# Patient Record
Sex: Female | Born: 1985 | Race: Black or African American | Hispanic: No | Marital: Single | Smoking: Never smoker
Health system: Southern US, Community
[De-identification: ages and names within clinical notes are randomized; demographics above are authoritative.]

## PROBLEM LIST (undated history)

## (undated) DIAGNOSIS — J45909 Unspecified asthma, uncomplicated: Secondary | ICD-10-CM

## (undated) DIAGNOSIS — T8859XA Other complications of anesthesia, initial encounter: Secondary | ICD-10-CM

## (undated) DIAGNOSIS — D649 Anemia, unspecified: Secondary | ICD-10-CM

## (undated) DIAGNOSIS — F32A Depression, unspecified: Secondary | ICD-10-CM

## (undated) DIAGNOSIS — T4145XA Adverse effect of unspecified anesthetic, initial encounter: Secondary | ICD-10-CM

## (undated) DIAGNOSIS — F329 Major depressive disorder, single episode, unspecified: Secondary | ICD-10-CM

## (undated) HISTORY — PX: CHOLECYSTECTOMY: SHX55

## (undated) HISTORY — PX: TUBAL LIGATION: SHX77

---

## 2009-12-28 ENCOUNTER — Emergency Department (HOSPITAL_BASED_OUTPATIENT_CLINIC_OR_DEPARTMENT_OTHER): Admission: EM | Admit: 2009-12-28 | Discharge: 2009-12-29 | Payer: Self-pay | Admitting: Emergency Medicine

## 2009-12-28 ENCOUNTER — Ambulatory Visit: Payer: Self-pay | Admitting: Radiology

## 2010-05-04 ENCOUNTER — Emergency Department (HOSPITAL_BASED_OUTPATIENT_CLINIC_OR_DEPARTMENT_OTHER): Admission: EM | Admit: 2010-05-04 | Discharge: 2010-05-04 | Payer: Self-pay | Admitting: Emergency Medicine

## 2010-06-29 ENCOUNTER — Emergency Department (HOSPITAL_BASED_OUTPATIENT_CLINIC_OR_DEPARTMENT_OTHER): Admission: EM | Admit: 2010-06-29 | Discharge: 2010-06-29 | Payer: Self-pay | Admitting: Emergency Medicine

## 2012-02-11 ENCOUNTER — Emergency Department (HOSPITAL_BASED_OUTPATIENT_CLINIC_OR_DEPARTMENT_OTHER)
Admission: EM | Admit: 2012-02-11 | Discharge: 2012-02-11 | Disposition: A | Discharge status: Home / Self Care | Payer: PRIVATE HEALTH INSURANCE | Attending: Emergency Medicine | Admitting: Emergency Medicine

## 2012-02-11 ENCOUNTER — Encounter (HOSPITAL_BASED_OUTPATIENT_CLINIC_OR_DEPARTMENT_OTHER): Payer: Self-pay | Admitting: Emergency Medicine

## 2012-02-11 DIAGNOSIS — Z79899 Other long term (current) drug therapy: Secondary | ICD-10-CM | POA: Insufficient documentation

## 2012-02-11 DIAGNOSIS — J45909 Unspecified asthma, uncomplicated: Secondary | ICD-10-CM | POA: Insufficient documentation

## 2012-02-11 HISTORY — DX: Unspecified asthma, uncomplicated: J45.909

## 2012-02-11 MED ORDER — ALBUTEROL SULFATE (5 MG/ML) 0.5% IN NEBU
5.0000 mg | INHALATION_SOLUTION | Freq: Once | RESPIRATORY_TRACT | Status: AC
  Administered 2012-02-11: 5 mg via RESPIRATORY_TRACT
  Filled 2012-02-11: qty 1

## 2012-02-11 MED ORDER — ALBUTEROL SULFATE HFA 108 (90 BASE) MCG/ACT IN AERS
2.0000 | INHALATION_SPRAY | Freq: Once | RESPIRATORY_TRACT | Status: AC
  Administered 2012-02-11: 2 via RESPIRATORY_TRACT
  Filled 2012-02-11: qty 6.7

## 2012-02-11 NOTE — ED Provider Notes (Signed)
History     CSN: [id number]  Arrival date & time 02/11/12  4098   First MD Initiated Contact with Patient 02/11/12 1853      Chief Complaint  Patient presents with  . Shortness of Breath    (Consider location/radiation/quality/duration/timing/severity/associated sxs/prior treatment) HPI Comments: Pt c/o sob today after running out of her inhaler:pt states that she uses her inhaler 5 to six times a day generally  Patient is a 26 y.o. female presenting with shortness of breath. The history is provided by the patient. No language interpreter was used.  Shortness of Breath  The current episode started today. The problem occurs continuously. The problem has been unchanged. The problem is mild. Nothing aggravates the symptoms. Associated symptoms include shortness of breath.    Past Medical History  Diagnosis Date  . Asthma     Past Surgical History  Procedure Date  . Cesarean section     History reviewed. No pertinent family history.  History  Substance Use Topics  . Smoking status: Not on file  . Smokeless tobacco: Not on file  . Alcohol Use: No    OB History    Grav Para Term Preterm Abortions TAB SAB Ect Mult Living   1               Review of Systems  Constitutional: Negative.   Respiratory: Positive for shortness of breath.   Cardiovascular: Negative.   Neurological: Negative.     Allergies  Review of patient's allergies indicates no known allergies.  Home Medications  No current outpatient prescriptions on file.  BP 140/66  Pulse 97  Temp 97.6 F (36.4 C) (Oral)  Resp 18  Ht 5\' 5"  (1.651 m)  Wt 270 lb (122.471 kg)  BMI 44.93 kg/m2  SpO2 97%  Physical Exam  Nursing note and vitals reviewed. Constitutional: She appears well-developed and well-nourished.  HENT:  Head: Normocephalic and atraumatic.  Right Ear: External ear normal.  Left Ear: External ear normal.  Eyes: Conjunctivae and EOM are normal.  Neck: Neck supple.  Cardiovascular:  Normal rate and regular rhythm.   Pulmonary/Chest: Effort normal. She has wheezes.  Musculoskeletal: Normal range of motion.  Neurological: She is alert.    ED Course  Procedures (including critical care time)  Labs Reviewed - No data to display No results found.   1. Asthma       MDM  Pt no longer wheezing and feeling better after treatment;will send home with inhaler:discussed with pt that she may need to be on a different regimen for asthma as that is to frequent to use he inhaler       Teressa Lower, NP 02/11/12 2029  Teressa Lower, NP 02/11/12 2030

## 2012-02-11 NOTE — Patient Instructions (Signed)
Instructed pt on the proper use of administering albuteral mdi via aerochamber pt tolerated well 

## 2012-02-11 NOTE — ED Notes (Signed)
Sob today.  Hx of asthma. No treatment at home.  No acute resp distress.

## 2012-02-11 NOTE — ED Provider Notes (Signed)
Medical screening examination/treatment/procedure(s) were performed by non-physician practitioner and as supervising physician I was immediately available for consultation/collaboration.  Travonta Gill T [place], MD 02/11/12 2319 

## 2012-08-07 ENCOUNTER — Encounter (HOSPITAL_COMMUNITY): Payer: Self-pay | Admitting: *Deleted

## 2012-08-07 ENCOUNTER — Emergency Department (HOSPITAL_COMMUNITY)
Admission: EM | Admit: 2012-08-07 | Discharge: 2012-08-07 | Disposition: A | Discharge status: Home / Self Care | Payer: PRIVATE HEALTH INSURANCE | Attending: Emergency Medicine | Admitting: Emergency Medicine

## 2012-08-07 DIAGNOSIS — Z79899 Other long term (current) drug therapy: Secondary | ICD-10-CM | POA: Insufficient documentation

## 2012-08-07 DIAGNOSIS — J45901 Unspecified asthma with (acute) exacerbation: Secondary | ICD-10-CM | POA: Insufficient documentation

## 2012-08-07 MED ORDER — ALBUTEROL SULFATE HFA 108 (90 BASE) MCG/ACT IN AERS
2.0000 | INHALATION_SPRAY | RESPIRATORY_TRACT | Status: DC | PRN
  Administered 2012-08-07: 2 via RESPIRATORY_TRACT
  Filled 2012-08-07: qty 6.7

## 2012-08-07 MED ORDER — ALBUTEROL SULFATE (5 MG/ML) 0.5% IN NEBU
5.0000 mg | INHALATION_SOLUTION | Freq: Once | RESPIRATORY_TRACT | Status: AC
  Administered 2012-08-07: 5 mg via RESPIRATORY_TRACT

## 2012-08-07 MED ORDER — ALBUTEROL SULFATE (2.5 MG/3ML) 0.083% IN NEBU: 2.5000 mg | INHALATION_SOLUTION | Freq: Four times a day (QID) | RESPIRATORY_TRACT | Status: DC | PRN

## 2012-08-07 MED ORDER — PREDNISONE 20 MG PO TABS
60.0000 mg | ORAL_TABLET | Freq: Once | ORAL | Status: AC
Start: 2012-08-07 — End: 2012-08-07
  Administered 2012-08-07: 60 mg via ORAL
  Filled 2012-08-07: qty 3

## 2012-08-07 MED ORDER — IPRATROPIUM BROMIDE 0.02 % IN SOLN
0.5000 mg | Freq: Once | RESPIRATORY_TRACT | Status: AC
  Administered 2012-08-07: 0.5 mg via RESPIRATORY_TRACT

## 2012-08-07 MED ORDER — ALBUTEROL SULFATE HFA 108 (90 BASE) MCG/ACT IN AERS: 2.0000 | INHALATION_SPRAY | RESPIRATORY_TRACT | Status: DC | PRN

## 2012-08-07 MED ORDER — AEROCHAMBER PLUS W/MASK MISC
1.0000 | Freq: Once | Status: AC
  Administered 2012-08-07: 1
  Filled 2012-08-07: qty 1

## 2012-08-07 MED ORDER — PREDNISONE 20 MG PO TABS: 60.0000 mg | ORAL_TABLET | Freq: Once | ORAL | Status: DC

## 2012-08-07 MED ORDER — PREDNISONE 20 MG PO TABS: 60.0000 mg | ORAL_TABLET | Freq: Every day | ORAL | Status: DC

## 2012-08-07 NOTE — ED Notes (Signed)
The pt is c/o difficulty breathing ??? Length of time  Audible wheezes.  Hx asthma

## 2012-08-07 NOTE — ED Notes (Signed)
Patient states that she feels a lot better.  When she first got here she was unable to talk.  Sitting up in the bed talking with her friends.  Lungs clear bilaterally
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
## 2012-10-21 ENCOUNTER — Encounter (HOSPITAL_BASED_OUTPATIENT_CLINIC_OR_DEPARTMENT_OTHER): Payer: Self-pay | Admitting: Emergency Medicine

## 2012-10-21 ENCOUNTER — Emergency Department (HOSPITAL_BASED_OUTPATIENT_CLINIC_OR_DEPARTMENT_OTHER)
Admission: EM | Admit: 2012-10-21 | Discharge: 2012-10-21 | Disposition: A | Discharge status: Home / Self Care | Payer: Self-pay | Attending: Emergency Medicine | Admitting: Emergency Medicine

## 2012-10-21 DIAGNOSIS — Z79899 Other long term (current) drug therapy: Secondary | ICD-10-CM | POA: Insufficient documentation

## 2012-10-21 DIAGNOSIS — J45901 Unspecified asthma with (acute) exacerbation: Secondary | ICD-10-CM | POA: Insufficient documentation

## 2012-10-21 MED ORDER — PREDNISONE 20 MG PO TABS: 40.0000 mg | ORAL_TABLET | Freq: Every day | ORAL | Status: DC

## 2012-10-21 MED ORDER — ALBUTEROL SULFATE (5 MG/ML) 0.5% IN NEBU
INHALATION_SOLUTION | RESPIRATORY_TRACT | Status: AC
  Administered 2012-10-21: 2.5 mg
  Filled 2012-10-21: qty 0.5

## 2012-10-21 MED ORDER — ALBUTEROL SULFATE HFA 108 (90 BASE) MCG/ACT IN AERS
INHALATION_SPRAY | RESPIRATORY_TRACT | Status: AC
  Administered 2012-10-21: 15:00:00
  Filled 2012-10-21: qty 6.7

## 2012-10-21 MED ORDER — METHYLPREDNISOLONE SODIUM SUCC 125 MG IJ SOLR
125.0000 mg | Freq: Once | INTRAMUSCULAR | Status: AC
  Administered 2012-10-21: 125 mg via INTRAMUSCULAR
  Filled 2012-10-21: qty 2

## 2012-10-21 NOTE — ED Provider Notes (Signed)
History     CSN: [id number]  Arrival date & time 10/21/12  1424   First MD Initiated Contact with Patient 10/21/12 1428      Chief Complaint  Patient presents with  . Shortness of Breath    (Consider location/radiation/quality/duration/timing/severity/associated sxs/prior treatment) HPI Comments: Pt state that he is out of her inhaler  Patient is a 27 y.o. female presenting with shortness of breath. The history is provided by the patient. No language interpreter was used.  Shortness of Breath Severity:  Moderate Onset quality:  Gradual Timing:  Constant Progression:  Unchanged Chronicity:  Recurrent Relieved by:  Nothing Worsened by:  Nothing tried Ineffective treatments:  None tried Associated symptoms: no chest pain, no cough and no fever   Risk factors: tobacco use     Past Medical History  Diagnosis Date  . Asthma     Past Surgical History  Procedure Laterality Date  . Cesarean section    . Tubal ligation      No family history on file.  History  Substance Use Topics  . Smoking status: Not on file  . Smokeless tobacco: Not on file  . Alcohol Use: No    OB History   Grav Para Term Preterm Abortions TAB SAB Ect Mult Living   1               Review of Systems  Constitutional: Negative for fever.  Respiratory: Positive for shortness of breath. Negative for cough.   Cardiovascular: Negative for chest pain.    Allergies  Review of patient's allergies indicates no known allergies.  Home Medications   Current Outpatient Rx  Name  Route  Sig  Dispense  Refill  . albuterol (PROVENTIL HFA;VENTOLIN HFA) 108 (90 BASE) MCG/ACT inhaler   Inhalation   Inhale 2 puffs into the lungs every 4 (four) hours as needed for wheezing or shortness of breath. Patient used this medication for shortness of breath.   1 Inhaler   0   . albuterol (PROVENTIL HFA;VENTOLIN HFA) 108 (90 BASE) MCG/ACT inhaler   Inhalation   Inhale 2 puffs into the lungs every 6 (six) hours  as needed. For shortness of breath         . albuterol (PROVENTIL) (2.5 MG/3ML) 0.083% nebulizer solution   Nebulization   Take 3 mLs (2.5 mg total) by nebulization every 6 (six) hours as needed for wheezing.   75 mL   12   . ibuprofen (ADVIL,MOTRIN) 400 MG tablet   Oral   Take 400 mg by mouth every 4 (four) hours as needed. For pain         . oxyCODONE-acetaminophen (PERCOCET/ROXICET) 5-325 MG per tablet   Oral   Take 1 tablet by mouth every 4 (four) hours as needed. For pain         . predniSONE (DELTASONE) 20 MG tablet   Oral   Take 3 tablets (60 mg total) by mouth daily.   15 tablet   0     BP 136/76  Pulse 114  Temp(Src) 98.1 F (36.7 C) (Oral)  Resp 36  SpO2 95%  LMP 10/14/2012  Breastfeeding? Unknown  Physical Exam  Nursing note and vitals reviewed. Constitutional: She is oriented to person, place, and time. She appears well-developed and well-nourished.  HENT:  Head: Normocephalic and atraumatic.  Right Ear: External ear normal.  Left Ear: External ear normal.  Eyes: Conjunctivae and EOM are normal.  Neck: Normal range of motion. Neck supple.  Cardiovascular:  Normal rate and regular rhythm.   Pulmonary/Chest: She has wheezes.  Musculoskeletal: Normal range of motion.  Neurological: She is alert and oriented to person, place, and time.  Skin: Skin is warm and dry.  Psychiatric: She has a normal mood and affect.    ED Course  Procedures (including critical care time)  Labs Reviewed - No data to display No results found.   1. Asthma exacerbation       MDM  Pt is doing better at this time:will send home with  steroids and inhaler        Teressa Lower, NP 10/21/12 1524

## 2012-10-21 NOTE — ED Notes (Signed)
SHOB since this am.  Out of inhalers.

## 2012-10-24 NOTE — ED Provider Notes (Signed)
  Medical screening examination/treatment/procedure(s) were performed by non-physician practitioner and as supervising physician I was immediately available for consultation/collaboration.    Delvina Mizzell, MD 10/24/12 2350 

## 2012-12-19 ENCOUNTER — Emergency Department (HOSPITAL_BASED_OUTPATIENT_CLINIC_OR_DEPARTMENT_OTHER)
Admission: EM | Admit: 2012-12-19 | Discharge: 2012-12-19 | Disposition: A | Discharge status: Home / Self Care | Payer: No Typology Code available for payment source | Attending: Emergency Medicine | Admitting: Emergency Medicine

## 2012-12-19 ENCOUNTER — Emergency Department (HOSPITAL_BASED_OUTPATIENT_CLINIC_OR_DEPARTMENT_OTHER): Payer: No Typology Code available for payment source

## 2012-12-19 ENCOUNTER — Encounter (HOSPITAL_BASED_OUTPATIENT_CLINIC_OR_DEPARTMENT_OTHER): Payer: Self-pay | Admitting: *Deleted

## 2012-12-19 DIAGNOSIS — Z79899 Other long term (current) drug therapy: Secondary | ICD-10-CM | POA: Insufficient documentation

## 2012-12-19 DIAGNOSIS — M549 Dorsalgia, unspecified: Secondary | ICD-10-CM

## 2012-12-19 DIAGNOSIS — Y92410 Unspecified street and highway as the place of occurrence of the external cause: Secondary | ICD-10-CM | POA: Insufficient documentation

## 2012-12-19 DIAGNOSIS — IMO0002 Reserved for concepts with insufficient information to code with codable children: Secondary | ICD-10-CM | POA: Insufficient documentation

## 2012-12-19 DIAGNOSIS — Y9389 Activity, other specified: Secondary | ICD-10-CM | POA: Insufficient documentation

## 2012-12-19 DIAGNOSIS — J45909 Unspecified asthma, uncomplicated: Secondary | ICD-10-CM | POA: Insufficient documentation

## 2012-12-19 MED ORDER — HYDROCODONE-ACETAMINOPHEN 5-325 MG PO TABS: 1.0000 | ORAL_TABLET | Freq: Four times a day (QID) | ORAL | Status: DC | PRN

## 2012-12-19 MED ORDER — NAPROXEN 500 MG PO TABS: 500.0000 mg | ORAL_TABLET | Freq: Two times a day (BID) | ORAL | Status: DC
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
## 2014-02-03 ENCOUNTER — Emergency Department (HOSPITAL_BASED_OUTPATIENT_CLINIC_OR_DEPARTMENT_OTHER)
Admission: EM | Admit: 2014-02-03 | Discharge: 2014-02-03 | Disposition: A | Discharge status: Home / Self Care | Payer: BC Managed Care – PPO | Attending: Emergency Medicine | Admitting: Emergency Medicine

## 2014-02-03 ENCOUNTER — Encounter (HOSPITAL_BASED_OUTPATIENT_CLINIC_OR_DEPARTMENT_OTHER): Payer: Self-pay | Admitting: Emergency Medicine

## 2014-02-03 DIAGNOSIS — Z791 Long term (current) use of non-steroidal anti-inflammatories (NSAID): Secondary | ICD-10-CM | POA: Insufficient documentation

## 2014-02-03 DIAGNOSIS — J45901 Unspecified asthma with (acute) exacerbation: Secondary | ICD-10-CM | POA: Insufficient documentation

## 2014-02-03 MED ORDER — PREDNISONE 50 MG PO TABS
60.0000 mg | ORAL_TABLET | Freq: Once | ORAL | Status: AC
  Administered 2014-02-03: 60 mg via ORAL
  Filled 2014-02-03 (×2): qty 1

## 2014-02-03 MED ORDER — PREDNISONE 20 MG PO TABS: ORAL_TABLET | ORAL | Status: DC

## 2014-02-03 MED ORDER — ALBUTEROL SULFATE HFA 108 (90 BASE) MCG/ACT IN AERS
INHALATION_SPRAY | RESPIRATORY_TRACT | Status: AC
  Filled 2014-02-03: qty 6.7

## 2014-02-03 MED ORDER — ALBUTEROL SULFATE HFA 108 (90 BASE) MCG/ACT IN AERS: 2.0000 | INHALATION_SPRAY | RESPIRATORY_TRACT | Status: DC | PRN

## 2014-02-03 MED ORDER — IPRATROPIUM-ALBUTEROL 0.5-2.5 (3) MG/3ML IN SOLN
RESPIRATORY_TRACT | Status: AC
  Administered 2014-02-03: 3 mL
  Filled 2014-02-03: qty 3

## 2014-02-03 MED ORDER — ALBUTEROL SULFATE HFA 108 (90 BASE) MCG/ACT IN AERS
2.0000 | INHALATION_SPRAY | RESPIRATORY_TRACT | Status: DC | PRN
  Administered 2014-02-03: 2 via RESPIRATORY_TRACT

## 2014-02-03 MED ORDER — ALBUTEROL SULFATE (2.5 MG/3ML) 0.083% IN NEBU
INHALATION_SOLUTION | RESPIRATORY_TRACT | Status: AC
  Administered 2014-02-03: 2.5 mg
  Filled 2014-02-03: qty 3

## 2014-02-03 NOTE — Discharge Instructions (Signed)
Asthma, Acute Bronchospasm °Acute bronchospasm caused by asthma is also referred to as an asthma attack. Bronchospasm means your air passages become narrowed. The narrowing is caused by inflammation and tightening of the muscles in the air tubes (bronchi) in your lungs. This can make it hard to breathe or cause you to wheeze and cough. °CAUSES °Possible triggers are: °· Animal dander from the skin, hair, or feathers of animals. °· Dust mites contained in house dust. °· Cockroaches. °· Pollen from trees or grass. °· Mold. °· Cigarette or tobacco smoke. °· Air pollutants such as dust, household cleaners, hair sprays, aerosol sprays, paint fumes, strong chemicals, or strong odors. °· Cold air or weather changes. Cold air may trigger inflammation. Winds increase molds and pollens in the air. °· Strong emotions such as crying or laughing hard. °· Stress. °· Certain medicines such as aspirin or beta-blockers. °· Sulfites in foods and drinks, such as dried fruits and wine. °· Infections or inflammatory conditions, such as a flu, cold, or inflammation of the nasal membranes (rhinitis). °· Gastroesophageal reflux disease (GERD). GERD is a condition where stomach acid backs up into your esophagus. °· Exercise or strenuous activity. °SIGNS AND SYMPTOMS  °· Wheezing. °· Excessive coughing, particularly at night. °· Chest tightness. °· Shortness of breath. °DIAGNOSIS  °Your health care provider will ask you about your medical history and perform a physical exam. A chest X-ray or blood testing may be performed to look for other causes of your symptoms or other conditions that may have triggered your asthma attack.  °TREATMENT  °Treatment is aimed at reducing inflammation and opening up the airways in your lungs.  Most asthma attacks are treated with inhaled medicines. These include quick relief or rescue medicines (such as bronchodilators) and controller medicines (such as inhaled corticosteroids). These medicines are sometimes  given through an inhaler or a nebulizer. Systemic steroid medicine taken by mouth or given through an IV tube also can be used to reduce the inflammation when an attack is moderate or severe. Antibiotic medicines are only used if a bacterial infection is present.  °HOME CARE INSTRUCTIONS  °· Rest. °· Drink plenty of liquids. This helps the mucus to remain thin and be easily coughed up. Only use caffeine in moderation and do not use alcohol until you have recovered from your illness. °· Do not smoke. Avoid being exposed to secondhand smoke. °· You play a critical role in keeping yourself in good health. Avoid exposure to things that cause you to wheeze or to have breathing problems. °· Keep your medicines up-to-date and available. Carefully follow your health care provider's treatment plan. °· Take your medicine exactly as prescribed. °· When pollen or pollution is bad, keep windows closed and use an air conditioner or go to places with air conditioning. °· Asthma requires careful medical care. See your health care provider for a follow-up as advised. If you are more than [redacted] weeks pregnant and you were prescribed any new medicines, let your obstetrician know about the visit and how you are doing. Follow up with your health care provider as directed. °· After you have recovered from your asthma attack, make an appointment with your outpatient doctor to talk about ways to reduce the likelihood of future attacks. If you do not have a doctor who manages your asthma, make an appointment with a primary care doctor to discuss your asthma. °SEEK IMMEDIATE MEDICAL CARE IF:  °· You are getting worse. °· You have trouble breathing. If severe, call your local   emergency services (911 in the U.S.).  You develop chest pain or discomfort.  You are vomiting.  You are not able to keep fluids down.  You are coughing up yellow, green, brown, or bloody sputum.  You have a fever and your symptoms suddenly get worse.  You have  trouble swallowing. MAKE SURE YOU:   Understand these instructions.  Will watch your condition.  Will get help right away if you are not doing well or get worse. Document Released: 11/18/2006 Document Revised: 08/08/2013 Document Reviewed: 02/08/2013 [facility] Patient Information 2015 [place], [place]. This information is not intended to replace advice given to you by your health care provider. Make sure you discuss any questions you have with your health care provider.  Asthma, Adult Asthma is a condition of the lungs in which the airways tighten and narrow. Asthma can make it hard to breathe. Asthma cannot be cured, but medicine and lifestyle changes can help control it. Asthma may be started (triggered) by:  Animal skin flakes (dander).  Dust.  Cockroaches.  Pollen.  Mold.  Smoke.  Cleaning products.  Hair sprays or aerosol sprays.  Paint fumes or strong smells.  Cold air, weather changes, and winds.  Crying or laughing hard.  Stress.  Certain medicines or drugs.  Foods, such as dried fruit, potato chips, and sparkling grape juice.  Infections or conditions (colds, flu).  Exercise.  Certain medical conditions or diseases.  Exercise or tiring activities. HOME CARE   Take medicine as told by your doctor.  Use a peak flow meter as told by your doctor. A peak flow meter is a tool that measures how well the lungs are working.  Record and keep track of the peak flow meter's readings.  Understand and use the asthma action plan. An asthma action plan is a written plan for taking care of your asthma and treating your attacks.  To help prevent asthma attacks:  Do not smoke. Stay away from secondhand smoke.  Change your heating and air conditioning filter often.  Limit your use of fireplaces and wood stoves.  Get rid of pests (such as roaches and mice) and their droppings.  Throw away plants if you see mold on them.  Clean your floors. Dust regularly. Use  cleaning products that do not smell.  Have someone vacuum when you are not home. Use a vacuum cleaner with a HEPA filter if possible.  Replace carpet with wood, tile, or vinyl flooring. Carpet can trap animal skin flakes and dust.  Use allergy-proof pillows, mattress covers, and box spring covers.  Wash bed sheets and blankets every week in hot water and dry them in a dryer.  Use blankets that are made of polyester or cotton.  Clean bathrooms and kitchens with bleach. If possible, have someone repaint the walls in these rooms with mold-resistant paint. Keep out of the rooms that are being cleaned and painted.  Wash hands often. GET HELP IF:  You have make a whistling sound when breaking (wheeze), have shortness of breath, or have a cough even if taking medicine to prevent attacks.  The colored mucus you cough up (sputum) is thicker than usual.  The colored mucus you cough up changes from clear or white to yellow, green, gray, or bloody.  You have problems from the medicine you are taking such as:  A rash.  Itching.  Swelling.  Trouble breathing.  You need reliever medicines more than 2-3 times a week.  Your peak flow measurement is still at  50-79% of your personal best after following the action plan for 1 hour. GET HELP RIGHT AWAY IF:   You seem to be worse and are not responding to medicine during an asthma attack.  You are short of breath even at rest.  You get short of breath when doing very little activity.  You have trouble eating, drinking, or talking.  You have chest pain.  You have a fast heartbeat.  Your lips or fingernails start to turn blue.  You are lightheaded, dizzy, or faint.  Your peak flow is less than 50% of your personal best.  You have a fever or lasting symptoms for more than 2-3 days.  You have a fever and your symptoms suddenly get worse. MAKE SURE YOU:   Understand these instructions.  Will watch your condition.  Will get help  right away if you are not doing well or get worse. Document Released: 01/20/2008 Document Revised: 05/24/2013 Document Reviewed: 03/02/2013 [facility] Patient Information 2015 [place], [place]. This information is not intended to replace advice given to you by your health care provider. Make sure you discuss any questions you have with your health care provider.   Emergency Department Resource Guide 1) Find a Doctor and Pay Out of Pocket Although you won't have to find out who is covered by your insurance plan, it is a good idea to ask around and get recommendations. You will then need to call the office and see if the doctor you have chosen will accept you as a new patient and what types of options they offer for patients who are self-pay. Some doctors offer discounts or will set up payment plans for their patients who do not have insurance, but you will need to ask so you aren't surprised when you get to your appointment.  2) Contact Your Local Health Department Not all health departments have doctors that can see patients for sick visits, but many do, so it is worth a call to see if yours does. If you don't know where your local health department is, you can check in your phone book. The CDC also has a tool to help you locate your state's health department, and many state websites also have listings of all of their local health departments.  3) Find a Walk-in Clinic If your illness is not likely to be very severe or complicated, you may want to try a walk in clinic. These are popping up all over the country in pharmacies, drugstores, and shopping centers. They're usually staffed by nurse practitioners or physician assistants that have been trained to treat common illnesses and complaints. They're usually fairly quick and inexpensive. However, if you have serious medical issues or chronic medical problems, these are probably not your best option.  No Primary Care Doctor: - Call Health Connect at   [phone] - they can help you locate a primary care doctor that  accepts your insurance, provides certain services, etc. - Physician Referral Service- [phone]  Chronic Pain Problems: Organization         Address  Phone   Notes  [facility]  [phone] Patients need to be referred by their primary care doctor.   Medication Assistance: Organization         Address  Phone   Notes  [facility] Medication [facility] [address] [place], [place] [address] [phone] --Must be a resident of [facility] [place] -- Must have NO insurance coverage whatsoever (no Medicaid/ Medicare, etc.) --  The pt. MUST have a primary care doctor that directs their care regularly and follows them in the community   MedAssist  [phone]) [phone]   [employer]  [phone]) [phone]    Agencies that provide inexpensive medical care: Organization         Address  Phone   Notes  [facility]  [phone]) [phone]   [facility]    [phone]   [facility] [address] [place], [place] [address] [phone]   [facility] [address], [place] [phone]   Planned Parenthood    [phone](336) [phone]   [facility]    [phone]   [facility]  [address], [place] Phone:  [phone]) [phone], Fax:  [phone]) [phone] Hours of Operation:  9 am - 6 pm, M-F.  Also accepts Medicaid/Medicare and self-pay.  [facility]  [address], [place] Phone: [phone], Fax: [phone]. Hours of Operation:  8:30 am - 5:30 pm, M-F.  Also accepts Medicaid and self-pay.  [facility] [place] [address], [place] Phone: [phone]   [facility] [address], [place], [place] [phone], Ext. [phone] Mondays & Thursdays: 7-9 AM.  First 15 patients are seen on a first come, first serve basis.     Medicaid-accepting [facility] [place] Providers:  Organization         Address  Phone   Notes  [facility] [address], [place] [phone]) [phone] Also accepts self-pay patients.  [facility] [address], [place]  [phone]   [facility] [address], [place] [phone]   [facility] [address] [place] [address], [place] [phone]) [phone]   Renaye RakersVeita Bland [address], [place]   [phone] Only accepts [place] Access [place] patients after they have their name applied to their card.   Self-Pay (no insurance) in [facility] [place]:  Organization         Address  Phone   Notes  Sickle Cell Patients, [facility] [address], [place] [phone]   [facility] [address], [place] [phone]) [phone]   [facility] [place]  [address], [place] [phone]) [phone]   [facility]/Dr. Osei-Bonsu  [address] [place] [address], [place] or [phone], [place] [phone] Phone number for both [place] and [place] locations is the same.  [facility] [address], [place] [phone]   [facility] [place] [address] [place] [address], [place] or [address] [phone]   [facility] [address], [place] [phone]) [phone], phone; [phone]) [phone], fax Sees patients 1st and 3rd Saturday of every month.  Must not qualify for public or private insurance (i.e. Medicaid, Medicare, [place] Health Choice, Veterans' Benefits)  Household income should be no more than 200% of the poverty level The clinic cannot treat you if you are pregnant or think you are pregnant  Sexually transmitted diseases are not treated at the clinic.   Dental Care: Organization         Address  Phone  Notes  [facility]  Department of Public Health [facility] [address], [place] [phone] Accepts children up to age 35 who are enrolled in Medicaid or [place] Health Choice; pregnant women with a Medicaid card; and children who have applied for Medicaid or [place] Health Choice, but were declined, whose parents can pay a reduced fee at time of service.  [facility]  [address], [place] [phone] Accepts children up to age 59 who are enrolled in [place] or [place]; pregnant women with a Medicaid card; and children who have applied for Medicaid or [place] Health Choice, but were declined, whose parents can pay a reduced fee at time of service.  [facility]  [place] [phone] Patients are seen by appointment only. Walk-ins are not accepted. In[place] will see patients 66 years of age and older. Monday - Tuesday (8am-5pm) Most Wednesdays (8:30-5pm) $30 per visit, cash only  [facility] Access [facility]  [address], [facility] [phone] Patients are seen by appointment only. Walk-ins are not accepted. [place] will see patients 60 years of age and older. One Wednesday Evening (Monthly: Volunteer Based).  $30 per visit, cash only  [place]  [phone] for adults; Children under age 17, call Graduate Pediatric Dentistry at [phone]. Children aged 8-14, please call [phone] to request a pediatric application.  Dental services are provided in all areas of dental care including fillings, crowns and bridges, complete and partial dentures, implants, gum treatment, root canals, and extractions. Preventive care is also provided. Treatment is provided to both adults and children. Patients are selected via a lottery and there is often a waiting list.   [facility] [address], [place]  [phone]  [email or web address]   [facility] [address] [place], [place] [phone], Ext. [phone] Second and Fourth Thursday of each month, opens at 6:30 AM; Clinic ends at 9 AM.  Patients are seen on a first-come first-served basis, and a limited number are seen during each clinic.   [facility]  [address] Hillard Danker [place], [place] [phone]   Eligibility Requirements You must have lived in [place], [place], or [place] counties for at least the last three months.   You cannot be eligible for state or federal sponsored [employer], including [employer], [place], or [employer].   You generally cannot be eligible for healthcare insurance through your employer.    How to apply: Eligibility screenings are held every Tuesday and Wednesday afternoon from 1:00 pm until 4:00 pm. You do not need an appointment for the interview!  [facility] [address] [place], [place]   [place] Department  [place]  [phone]    Behavioral Health Resources in the Community: Intensive Outpatient Programs Organization         Address  Phone  Notes  Orinda Lena. [address] [place], [place] [phone]   [facility] Outpatient [address] [place], [place]   ADS: [facility] Drug S[facility] [address] [place], [place]   [place] [address]  [place], [place] or [phone]   Substance Abuse Resources Organization         Address  Phone  Notes  Alcohol and Drug  Services  [phone]   [facility]  [phone]   [facility] [place]  [phone]   Floydene Flock  [phone]   Residential & Outpatient Substance Abuse Program  [phone]   Psychological Services Organization          Address  Phone  Notes  [facility]  [phone]   [facility]  [phone]   [facility] [address] [place] [phone] or [phone]    Mobile Crisis Teams Organization         Address  Phone  Notes  [facility], Mobile Crisis Care Unit  [phone]   [facility]  [address] [place], [place] [phone]   Doristine Locks [address] [place] [phone]    Self-Help/Support Groups Organization         Address  Phone             Notes  [facility] [place] - variety of support groups  [phone]- I7437963 Call for more information  Narcotics Anonymous (NA), [facility] [address], [employer] [place]  2 meetings at this location   Statistician         Address  Phone  Notes  [facility] [address],    [place]  [phone]   [facility]  [address] [place] [address]27, [place], [place] [phone]   [facility] [address] [place], [place] [phone] Admissions: 8am-3pm M-F  [facility] [address]    [place], [place] [phone]   [facility] [address] [place], [place], [place] [phone]   [facility] [address]  [place], [place] [phone]   [facility] - Intensive Outpatient [address], [place], [place] [phone]   [facility] ([facility] [address]  [place], [place] [phone] or [phone]   [facility] ([facility]) [address] [place], [place] [phone] Accepts Medicaid  [facility] [place] [address]  [place] [phone] Substance Abuse/Addiction Treatment   [facility] Organization         Address  Phone  Notes  [facility]  [phone]   Angie Fava, PhD [address]vin Kn[address] [place], [place]   [phone] or [phone]   [facility]   [address] [place], [place] [phone]   [facility] [address] [place], [place] [phone] Insurance/Medicaid/sponsorship through [facility] and Families [address]                                    [place], [place] [phone] Therapy/tele-psych/case  [facility] [address], [place] [phone]    Dr. Lolly Mustache  [phone]   [facility] [place]  [employer] [facility] Dept. 1) [address] [place] 2) [address] [place] 3)  [address], [place] [phone]  [phone]   [facility] Child Abuse Hotline [phone] or [phone] (After Hours)

## 2014-02-03 NOTE — ED Provider Notes (Signed)
CSN: [id number]     Arrival date & time 02/03/14  [id number] History   First MD Initiated Contact with Patient 02/03/14 [id number]     Chief Complaint  Patient presents with  . Wheezing     (Consider location/radiation/quality/duration/timing/severity/associated sxs/prior Treatment) HPI 28 year old female with history of asthma started coughing and wheezing just a little bit yesterday but this morning at work was coughing and wheezing and felt short of breath so she came to the ED for evaluation. She is mild to moderate shortness of breath today. She is no fever no confusion no rash no vomiting no chest pain no abdominal pain and no other concerns. There is no treatment prior to arrival. She has had wheezing like this multiple times in the past. She ran out of her inhaler and does not have a primary care Dr. Past Medical History  Diagnosis Date  . Asthma    Past Surgical History  Procedure Laterality Date  . Cesarean section    . Tubal ligation     No family history on file. History  Substance Use Topics  . Smoking status: Never Smoker   . Smokeless tobacco: Not on file  . Alcohol Use: No   OB History   Grav Para Term Preterm Abortions TAB SAB Ect Mult Living   1              Review of Systems  10 Systems reviewed and are negative for acute change except as noted in the HPI.  Allergies  Review of patient's allergies indicates no known allergies.  Home Medications   Prior to Admission medications   Medication Sig Start Date End Date Taking? Authorizing Provider  albuterol (PROVENTIL HFA;VENTOLIN HFA) 108 (90 BASE) MCG/ACT inhaler Inhale 2 puffs into the lungs every 4 (four) hours as needed for wheezing or shortness of breath. Patient used this medication for shortness of breath. 08/07/12   Olivia Mackielga M Otter, MD  albuterol (PROVENTIL HFA;VENTOLIN HFA) 108 (90 BASE) MCG/ACT inhaler Inhale 2 puffs into the lungs every 6 (six) hours as needed. For shortness of breath    Historical Provider, MD   albuterol (PROVENTIL HFA;VENTOLIN HFA) 108 (90 BASE) MCG/ACT inhaler Inhale 2 puffs into the lungs every 2 (two) hours as needed for wheezing or shortness of breath (cough). 02/03/14   Hurman HornJohn M Bednar, MD  albuterol (PROVENTIL) (2.5 MG/3ML) 0.083% nebulizer solution Take 3 mLs (2.5 mg total) by nebulization every 6 (six) hours as needed for wheezing. 08/07/12   Olivia Mackielga M Otter, MD  HYDROcodone-acetaminophen (NORCO/VICODIN) 5-325 MG per tablet Take 1-2 tablets by mouth every 6 (six) hours as needed for pain. 12/19/12   Vanetta MuldersScott Zackowski, MD  ibuprofen (ADVIL,MOTRIN) 400 MG tablet Take 400 mg by mouth every 4 (four) hours as needed. For pain    Historical Provider, MD  naproxen (NAPROSYN) 500 MG tablet Take 1 tablet (500 mg total) by mouth 2 (two) times daily. 12/19/12   Vanetta MuldersScott Zackowski, MD  oxyCODONE-acetaminophen (PERCOCET/ROXICET) 5-325 MG per tablet Take 1 tablet by mouth every 4 (four) hours as needed. For pain    Historical Provider, MD  predniSONE (DELTASONE) 20 MG tablet Take 3 tablets (60 mg total) by mouth daily. 08/07/12   Olivia Mackielga M Otter, MD  predniSONE (DELTASONE) 20 MG tablet Take 2 tablets (40 mg total) by mouth daily. 10/21/12   Teressa LowerVrinda Pickering, NP  predniSONE (DELTASONE) 20 MG tablet 2 tabs po daily x 4 days 02/03/14   Hurman HornJohn M Bednar, MD   BP 126/62  Pulse 92  Temp(Src) 98.1 F (36.7 C) (Oral)  Resp 19  Ht 5\' 5"  (1.651 m)  Wt 240 lb (108.863 kg)  BMI 39.94 kg/m2  SpO2 99% Physical Exam  Nursing note and vitals reviewed. Constitutional:  Awake, alert, nontoxic appearance.  HENT:  Head: Atraumatic.  Eyes: Right eye exhibits no discharge. Left eye exhibits no discharge.  Neck: Neck supple.  Cardiovascular: Normal rate and regular rhythm.   No murmur heard. Pulmonary/Chest: She is in respiratory distress. She has wheezes. She has no rales. She exhibits no tenderness.  Mild to moderate respiratory distress upon arrival with pulse oximetry normal on room air at 95% with patient able to speak  full sentences without retractions or accessory muscle usage however she does have diffuse expiratory wheezes  Abdominal: Soft. Bowel sounds are normal. She exhibits no distension and no mass. There is no tenderness. There is no rebound and no guarding.  Musculoskeletal: She exhibits no edema and no tenderness.  Baseline ROM, no obvious new focal weakness.  Neurological: She is alert.  Mental status and motor strength appears baseline for patient and situation.  Skin: No rash noted.  Psychiatric: She has a normal mood and affect.    ED Course  Procedures (including critical care time) Pt feels improved after observation and/or treatment in ED.Patient informed of clinical course, understand medical decision-making process, and agree with plan. Labs Review Labs Reviewed - No data to display  Imaging Review No results found.   EKG Interpretation None      MDM   Final diagnoses:  Asthma exacerbation    I doubt any other EMC precluding discharge at this time including, but not necessarily limited to the following: status asthmaticus, SBI.    Hurman HornJohn M Bednar, MD 02/05/14 [phone]

## 2014-02-03 NOTE — ED Notes (Signed)
Patient audibly wheezing this morning. She has a hx of asthma and works in the heat. She does not have a regular dr and she does not have asthma medications at home.

## 2014-06-18 ENCOUNTER — Encounter (HOSPITAL_BASED_OUTPATIENT_CLINIC_OR_DEPARTMENT_OTHER): Payer: Self-pay | Admitting: Emergency Medicine

## 2015-05-31 DIAGNOSIS — J4540 Moderate persistent asthma, uncomplicated: Secondary | ICD-10-CM | POA: Insufficient documentation

## 2015-05-31 DIAGNOSIS — J309 Allergic rhinitis, unspecified: Secondary | ICD-10-CM | POA: Insufficient documentation

## 2015-06-03 ENCOUNTER — Ambulatory Visit: Payer: Self-pay | Admitting: Allergy and Immunology

## 2015-09-22 ENCOUNTER — Emergency Department (HOSPITAL_BASED_OUTPATIENT_CLINIC_OR_DEPARTMENT_OTHER)
Admission: EM | Admit: 2015-09-22 | Discharge: 2015-09-23 | Disposition: A | Discharge status: Home / Self Care | Payer: BLUE CROSS/BLUE SHIELD | Attending: Emergency Medicine | Admitting: Emergency Medicine

## 2015-09-22 ENCOUNTER — Encounter (HOSPITAL_BASED_OUTPATIENT_CLINIC_OR_DEPARTMENT_OTHER): Payer: Self-pay | Admitting: *Deleted

## 2015-09-22 DIAGNOSIS — Z79899 Other long term (current) drug therapy: Secondary | ICD-10-CM | POA: Diagnosis not present

## 2015-09-22 DIAGNOSIS — J4520 Mild intermittent asthma, uncomplicated: Secondary | ICD-10-CM

## 2015-09-22 DIAGNOSIS — J4521 Mild intermittent asthma with (acute) exacerbation: Secondary | ICD-10-CM | POA: Diagnosis not present

## 2015-09-22 DIAGNOSIS — Z7951 Long term (current) use of inhaled steroids: Secondary | ICD-10-CM | POA: Insufficient documentation

## 2015-09-22 DIAGNOSIS — Z3202 Encounter for pregnancy test, result negative: Secondary | ICD-10-CM | POA: Insufficient documentation

## 2015-09-22 DIAGNOSIS — R0602 Shortness of breath: Secondary | ICD-10-CM | POA: Diagnosis present

## 2015-09-22 MED ORDER — GUAIFENESIN ER 600 MG PO TB12
1200.0000 mg | ORAL_TABLET | Freq: Two times a day (BID) | ORAL | Status: DC
  Filled 2015-09-22: qty 2

## 2015-09-22 MED ORDER — PREDNISONE 50 MG PO TABS
60.0000 mg | ORAL_TABLET | Freq: Once | ORAL | Status: AC
  Administered 2015-09-23: 60 mg via ORAL
  Filled 2015-09-22: qty 1

## 2015-09-22 MED ORDER — IPRATROPIUM-ALBUTEROL 0.5-2.5 (3) MG/3ML IN SOLN
RESPIRATORY_TRACT | Status: AC
  Filled 2015-09-22: qty 3

## 2015-09-22 MED ORDER — LORATADINE 10 MG PO TABS
10.0000 mg | ORAL_TABLET | Freq: Once | ORAL | Status: AC
  Administered 2015-09-23: 10 mg via ORAL
  Filled 2015-09-22: qty 1

## 2015-09-22 MED ORDER — IPRATROPIUM-ALBUTEROL 0.5-2.5 (3) MG/3ML IN SOLN
3.0000 mL | Freq: Four times a day (QID) | RESPIRATORY_TRACT | Status: DC
  Administered 2015-09-22: 3 mL via RESPIRATORY_TRACT

## 2015-09-22 NOTE — ED Provider Notes (Signed)
CSN: [id number]     Arrival date & time 09/22/15  2348 History  By signing my name below, I, Bethel Born, attest that this documentation has been prepared under the direction and in the presence of Codylee Patil, MD. Electronically Signed: Bethel Born, ED Scribe. 09/22/2015. 11:57 PM   Chief Complaint  Patient presents with  . Shortness of Breath    Patient is a 30 y.o. female presenting with shortness of breath. The history is provided by the patient. No language interpreter was used.  Shortness of Breath Severity:  Moderate Onset quality:  Gradual Duration:  1 day Timing:  Constant Progression:  Unchanged Chronicity:  New Context: smoke exposure   Relieved by:  Nothing Worsened by:  Nothing tried Ineffective treatments:  None tried Associated symptoms: wheezing   Associated symptoms: no chest pain and no fever   Wheezing:    Severity:  Moderate   Onset quality:  Gradual   Timing:  Constant   Progression:  Unchanged   Chronicity:  Recurrent Risk factors: no hx of PE/DVT, no oral contraceptive use, no prolonged immobilization and no tobacco use    Sarah Drake is a 30 y.o. female with history of asthma who presents to the Emergency Department complaining of constant SOB with gradual onset today. Associated symptoms include nasal congestion, chest congestion, and wheezing. Cough drops and Theraflu provided insufficient relief in symptoms at home. She is out of her home inhalers.  Pt denies LE swelling.  She smokes marijuana but denies tobacco. Pt is not on hormonal birth control. She recently drove to [place] but took breaks. LNMP was 08/30/15.  Past Medical History  Diagnosis Date  . Asthma    Past Surgical History  Procedure Laterality Date  . Cesarean section    . Tubal ligation     History reviewed. No pertinent family history. Social History  Substance Use Topics  . Smoking status: Never Smoker   . Smokeless tobacco: None  . Alcohol Use: No   OB History     Gravida Para Term Preterm AB TAB SAB Ectopic Multiple Living   1              Review of Systems  Constitutional: Negative for fever.  HENT: Positive for congestion.   Respiratory: Positive for shortness of breath and wheezing.   Cardiovascular: Negative for chest pain and leg swelling.  All other systems reviewed and are negative.  Allergies  Review of patient's allergies indicates no known allergies.  Home Medications   Prior to Admission medications   Medication Sig Start Date End Date Taking? Authorizing Provider  albuterol (PROVENTIL HFA;VENTOLIN HFA) 108 (90 BASE) MCG/ACT inhaler Inhale 2 puffs into the lungs every 6 (six) hours as needed. For shortness of breath    Historical Provider, MD  beclomethasone (QVAR) 80 MCG/ACT inhaler Inhale 2 puffs into the lungs 2 (two) times daily.    Historical Provider, MD   BP 117/66 mmHg  Pulse 95  Temp(Src) 97.8 F (36.6 C) (Oral)  Resp 18  Ht  (1.651 m)  Wt 205 lb (92.987 kg)  BMI 34.11 kg/m2  SpO2 98%  LMP 08/30/2015 Physical Exam  Constitutional: She is oriented to person, place, and time. She appears well-developed and well-nourished. No distress.  HENT:  Head: Normocephalic.  Mouth/Throat: Oropharynx is clear and moist. No oropharyngeal exudate.  Moist mucous membranes No exudate No swelling of the lips, tongue, or uvula  Eyes: Pupils are equal, round, and reactive to light.  Neck:  Normal range of motion. Neck supple.  Trachea midline No bruit  Cardiovascular: Normal rate and regular rhythm.   Pulmonary/Chest: Effort normal. No stridor. No respiratory distress. She has wheezes. She has no rales.  Abdominal: Soft. She exhibits no mass. There is no tenderness. There is no rebound and no guarding.  Musculoskeletal: Normal range of motion.  Lymphadenopathy:    She has no cervical adenopathy.  Neurological: She is alert and oriented to person, place, and time.  Skin: Skin is warm and dry.  Psychiatric: She has a  normal mood and affect. Her behavior is normal.  Nursing note and vitals reviewed.   ED Course  Procedures (including critical care time) DIAGNOSTIC STUDIES: Oxygen Saturation is 98% on RA,  normal by my interpretation.    COORDINATION OF CARE: 11:56 PM Discussed treatment plan which includes lab work, CXR, a breathing treatment, prednisone, Claritin, and Mucinex with pt at bedside and pt agreed to plan.  Labs Review Labs Reviewed  PREGNANCY, URINE    Imaging Review No results found. I have personally reviewed and evaluated these images and lab results as part of my medical decision-making.   EKG Interpretation None      MDM   Final diagnoses:  None   PERC negative wells 0 highly doubt PE Medications  predniSONE (DELTASONE) tablet 60 mg (60 mg Oral Given 09/23/15 0017)  loratadine (CLARITIN) tablet 10 mg (10 mg Oral Given 09/23/15 0017)  guaiFENesin (ROBITUSSIN) 100 MG/5ML solution 100 mg (100 mg Oral Given 09/23/15 0017)  albuterol (PROVENTIL HFA;VENTOLIN HFA) 108 (90 Base) MCG/ACT inhaler 2 puff (2 puffs Inhalation Given 09/23/15 0137)   Feels better post meds.  RX for albuterol and prednisone given.  Strict return precautions given  I personally performed the services described in this documentation, which was scribed in my presence. The recorded information has been reviewed and is accurate.       Cy Blamer, MD 09/23/15 [phone]

## 2015-09-22 NOTE — ED Notes (Signed)
Pt has asthma.  States SOB x 1 day.  Pt speaking in broken sentences.  BS diminished bilaterally.

## 2015-09-23 ENCOUNTER — Emergency Department (HOSPITAL_BASED_OUTPATIENT_CLINIC_OR_DEPARTMENT_OTHER): Payer: BLUE CROSS/BLUE SHIELD

## 2015-09-23 ENCOUNTER — Encounter (HOSPITAL_BASED_OUTPATIENT_CLINIC_OR_DEPARTMENT_OTHER): Payer: Self-pay | Admitting: Emergency Medicine

## 2015-09-23 LAB — PREGNANCY, URINE: Preg Test, Ur: NEGATIVE

## 2015-09-23 MED ORDER — PREDNISONE 20 MG PO TABS: ORAL_TABLET | ORAL | Status: DC

## 2015-09-23 MED ORDER — ALBUTEROL SULFATE HFA 108 (90 BASE) MCG/ACT IN AERS
2.0000 | INHALATION_SPRAY | Freq: Once | RESPIRATORY_TRACT | Status: AC
  Administered 2015-09-23: 2 via RESPIRATORY_TRACT
  Filled 2015-09-23: qty 6.7

## 2015-09-23 MED ORDER — GUAIFENESIN 100 MG/5ML PO SOLN
5.0000 mL | Freq: Once | ORAL | Status: AC
  Administered 2015-09-23: 100 mg via ORAL
  Filled 2015-09-23: qty 5

## 2015-09-23 MED ORDER — ALBUTEROL SULFATE HFA 108 (90 BASE) MCG/ACT IN AERS: 1.0000 | INHALATION_SPRAY | Freq: Four times a day (QID) | RESPIRATORY_TRACT | Status: DC | PRN

## 2015-09-23 NOTE — Discharge Instructions (Signed)
Asthma, Adult Asthma is a condition of the lungs in which the airways tighten and narrow. Asthma can make it hard to breathe. Asthma cannot be cured, but medicine and lifestyle changes can help control it. Asthma may be started (triggered) by:  Animal skin flakes (dander).  Dust.  Cockroaches.  Pollen.  Mold.  Smoke.  Cleaning products.  Hair sprays or aerosol sprays.  Paint fumes or strong smells.  Cold air, weather changes, and winds.  Crying or laughing hard.  Stress.  Certain medicines or drugs.  Foods, such as dried fruit, potato chips, and sparkling grape juice.  Infections or conditions (colds, flu).  Exercise.  Certain medical conditions or diseases.  Exercise or tiring activities. HOME CARE   Take medicine as told by your doctor.  Use a peak flow meter as told by your doctor. A peak flow meter is a tool that measures how well the lungs are working.  Record and keep track of the peak flow meter's readings.  Understand and use the asthma action plan. An asthma action plan is a written plan for taking care of your asthma and treating your attacks.  To help prevent asthma attacks:  Do not smoke. Stay away from secondhand smoke.  Change your heating and air conditioning filter often.  Limit your use of fireplaces and wood stoves.  Get rid of pests (such as roaches and mice) and their droppings.  Throw away plants if you see mold on them.  Clean your floors. Dust regularly. Use cleaning products that do not smell.  Have someone vacuum when you are not home. Use a vacuum cleaner with a HEPA filter if possible.  Replace carpet with wood, tile, or vinyl flooring. Carpet can trap animal skin flakes and dust.  Use allergy-proof pillows, mattress covers, and box spring covers.  Wash bed sheets and blankets every week in hot water and dry them in a dryer.  Use blankets that are made of polyester or cotton.  Clean bathrooms and kitchens with bleach.  If possible, have someone repaint the walls in these rooms with mold-resistant paint. Keep out of the rooms that are being cleaned and painted.  Wash hands often. GET HELP IF:  You have make a whistling sound when breaking (wheeze), have shortness of breath, or have a cough even if taking medicine to prevent attacks.  The colored mucus you cough up (sputum) is thicker than usual.  The colored mucus you cough up changes from clear or white to yellow, green, gray, or bloody.  You have problems from the medicine you are taking such as:  A rash.  Itching.  Swelling.  Trouble breathing.  You need reliever medicines more than 2-3 times a week.  Your peak flow measurement is still at 50-79% of your personal best after following the action plan for 1 hour.  You have a fever. GET HELP RIGHT AWAY IF:   You seem to be worse and are not responding to medicine during an asthma attack.  You are short of breath even at rest.  You get short of breath when doing very little activity.  You have trouble eating, drinking, or talking.  You have chest pain.  You have a fast heartbeat.  Your lips or fingernails start to turn blue.  You are light-headed, dizzy, or faint.  Your peak flow is less than 50% of your personal best.   This information is not intended to replace advice given to you by your health care provider. Make sure   you discuss any questions you have with your health care provider.   Document Released: 01/20/2008 Document Revised: 04/24/2015 Document Reviewed: 03/02/2013 Elsevier Interactive Patient Education 2016 [employer]  

## 2015-09-23 NOTE — ED Notes (Signed)
Given Rx x2, denies questions or needs, steady gait, alert, NAD, calm, "feel better, breathing easier, coughing less", RT at [facility] for d/c.

## 2016-09-20 ENCOUNTER — Emergency Department (HOSPITAL_BASED_OUTPATIENT_CLINIC_OR_DEPARTMENT_OTHER)
Admission: EM | Admit: 2016-09-20 | Discharge: 2016-09-20 | Disposition: A | Discharge status: Home / Self Care | Payer: BLUE CROSS/BLUE SHIELD | Attending: Emergency Medicine | Admitting: Emergency Medicine

## 2016-09-20 ENCOUNTER — Emergency Department (HOSPITAL_BASED_OUTPATIENT_CLINIC_OR_DEPARTMENT_OTHER): Payer: BLUE CROSS/BLUE SHIELD

## 2016-09-20 ENCOUNTER — Encounter (HOSPITAL_BASED_OUTPATIENT_CLINIC_OR_DEPARTMENT_OTHER): Payer: Self-pay | Admitting: Emergency Medicine

## 2016-09-20 DIAGNOSIS — Z79899 Other long term (current) drug therapy: Secondary | ICD-10-CM | POA: Diagnosis not present

## 2016-09-20 DIAGNOSIS — R110 Nausea: Secondary | ICD-10-CM | POA: Insufficient documentation

## 2016-09-20 DIAGNOSIS — J3489 Other specified disorders of nose and nasal sinuses: Secondary | ICD-10-CM | POA: Insufficient documentation

## 2016-09-20 DIAGNOSIS — R509 Fever, unspecified: Secondary | ICD-10-CM | POA: Diagnosis not present

## 2016-09-20 DIAGNOSIS — J029 Acute pharyngitis, unspecified: Secondary | ICD-10-CM | POA: Diagnosis not present

## 2016-09-20 DIAGNOSIS — R05 Cough: Secondary | ICD-10-CM | POA: Insufficient documentation

## 2016-09-20 DIAGNOSIS — R69 Illness, unspecified: Secondary | ICD-10-CM

## 2016-09-20 DIAGNOSIS — R5383 Other fatigue: Secondary | ICD-10-CM | POA: Diagnosis not present

## 2016-09-20 DIAGNOSIS — J4540 Moderate persistent asthma, uncomplicated: Secondary | ICD-10-CM | POA: Insufficient documentation

## 2016-09-20 DIAGNOSIS — J111 Influenza due to unidentified influenza virus with other respiratory manifestations: Secondary | ICD-10-CM

## 2016-09-20 MED ORDER — GUAIFENESIN-CODEINE 100-10 MG/5ML PO SOLN: 10.0000 mL | Freq: Four times a day (QID) | ORAL | 0 refills | Status: DC | PRN

## 2016-09-20 MED ORDER — ONDANSETRON 8 MG PO TBDP: ORAL_TABLET | ORAL | 0 refills | Status: DC

## 2016-09-20 NOTE — ED Provider Notes (Signed)
[facility]   CSN: [id number] Arrival date & time: 09/20/16  1155     History   Chief Complaint Chief Complaint  Patient presents with  . Influenza    HPI Sarah Drake is a 31 y.o. female.  Patient is a 31 year old female who presents with a 2 day history of body aches, chest congestion, cough, body aches, headache, and chills. She has tried over-the-counter medications with little relief.   The history is provided by the patient.  Influenza  Presenting symptoms: cough, fatigue, fever, nausea, rhinorrhea and sore throat   Severity:  Moderate Onset quality:  Sudden Duration:  2 days Chronicity:  New Relieved by:  Nothing Worsened by:  Nothing Ineffective treatments:  OTC medications Associated symptoms: chills     Past Medical History:  Diagnosis Date  . Asthma     Patient Active Problem List   Diagnosis Date Noted  . Moderate persistent asthma 05/31/2015  . Allergic rhinitis 05/31/2015    Past Surgical History:  Procedure Laterality Date  . CESAREAN SECTION    . TUBAL LIGATION      OB History    Gravida Para Term Preterm AB Living   1             SAB TAB Ectopic Multiple Live Births                   Home Medications    Prior to Admission medications   Medication Sig Start Date End Date Taking? Authorizing Provider  albuterol (PROVENTIL HFA;VENTOLIN HFA) 108 (90 BASE) MCG/ACT inhaler Inhale 2 puffs into the lungs every 6 (six) hours as needed. For shortness of breath   Yes Historical Provider, MD  albuterol (PROVENTIL HFA;VENTOLIN HFA) 108 (90 Base) MCG/ACT inhaler Inhale 1-2 puffs into the lungs every 6 (six) hours as needed for wheezing or shortness of breath. 09/23/15  Yes April Palumbo, MD  beclomethasone (QVAR) 80 MCG/ACT inhaler Inhale 2 puffs into the lungs 2 (two) times daily.    Historical Provider, MD  predniSONE (DELTASONE) 20 MG tablet 3 tabs po day one, then 2 po daily x 4 days 09/23/15   April Palumbo, MD     Family History No family history on file.  Social History Social History  Substance Use Topics  . Smoking status: Never Smoker  . Smokeless tobacco: Never Used  . Alcohol use No     Allergies   Patient has no known allergies.   Review of Systems Review of Systems  Constitutional: Positive for chills, fatigue and fever.  HENT: Positive for rhinorrhea and sore throat.   Respiratory: Positive for cough.   Gastrointestinal: Positive for nausea.  All other systems reviewed and are negative.    Physical Exam Updated Vital Signs BP 118/81 (BP Location: Left Arm)   Pulse 108   Temp 99.9 F (37.7 C) (Oral)   Resp 20   Ht 5\' 4"  (1.626 m)   Wt 200 lb (90.7 kg)   LMP 09/16/2016   SpO2 98%   BMI 34.33 kg/m   Physical Exam  Constitutional: She is oriented to person, place, and time. She appears well-developed and well-nourished. No distress.  HENT:  Head: Normocephalic and atraumatic.  Mouth/Throat: Oropharynx is clear and moist. No oropharyngeal exudate.  Neck: Normal range of motion. Neck supple.  Cardiovascular: Normal rate and regular rhythm.  Exam reveals no gallop and no friction rub.   No murmur heard. Pulmonary/Chest: Effort normal and breath sounds normal. No  respiratory distress. She has no wheezes.  Abdominal: Soft. Bowel sounds are normal. She exhibits no distension. There is no tenderness.  Musculoskeletal: Normal range of motion. She exhibits no edema.  Neurological: She is alert and oriented to person, place, and time.  Skin: Skin is warm and dry. She is not diaphoretic.  Nursing note and vitals reviewed.    ED Treatments / Results  Labs (all labs ordered are listed, but only abnormal results are displayed) Labs Reviewed - No data to display  EKG  EKG Interpretation None       Radiology No results found.  Procedures Procedures (including critical care time)  Medications Ordered in ED Medications - No data to display   Initial  Impression / Assessment and Plan / ED Course  I have reviewed the triage vital signs and the nursing notes.  Pertinent labs & imaging results that were available during my care of the patient were reviewed by me and considered in my medical decision making (see chart for details).  Patient's presentation and exam are consistent with a viral upper respiratory infection, possibly influenza. Her physical examination is unremarkable, lungs are clear, and there is no hypoxia. She will be treated with cough medication, rotating Tylenol and Motrin, and when necessary return/follow-up.  Final Clinical Impressions(s) / ED Diagnoses   Final diagnoses:  None    New Prescriptions New Prescriptions   No medications on file     Geoffery Lyonsouglas Kay Ricciuti, MD 09/20/16 1230

## 2016-09-20 NOTE — ED Triage Notes (Signed)
States , has chills, aching all over, h/a , dry, cough and nausea.

## 2016-09-20 NOTE — Discharge Instructions (Signed)
Robitussin with codeine as prescribed as needed for cough.  Zofran as needed for nausea.  Continue over-the-counter medications as needed for symptom relief.  Tylenol 1000 mg rotated with ibuprofen 600 mg every 4 hours as needed for pain or fever.  Return to the emergency department if you develop severe chest pain, difficulty breathing, or other new and concerning symptoms.

## 2016-11-22 ENCOUNTER — Encounter (HOSPITAL_BASED_OUTPATIENT_CLINIC_OR_DEPARTMENT_OTHER): Payer: Self-pay | Admitting: Emergency Medicine

## 2016-11-22 DIAGNOSIS — J45909 Unspecified asthma, uncomplicated: Secondary | ICD-10-CM | POA: Insufficient documentation

## 2016-11-22 DIAGNOSIS — Z79899 Other long term (current) drug therapy: Secondary | ICD-10-CM | POA: Diagnosis not present

## 2016-11-22 DIAGNOSIS — K0889 Other specified disorders of teeth and supporting structures: Secondary | ICD-10-CM | POA: Insufficient documentation

## 2016-11-22 NOTE — ED Triage Notes (Signed)
PT presents to ED with complaints of dental pain on top right. PT states she pulled the tooth out tonight but still has pain.

## 2016-11-23 ENCOUNTER — Emergency Department (HOSPITAL_BASED_OUTPATIENT_CLINIC_OR_DEPARTMENT_OTHER)
Admission: EM | Admit: 2016-11-23 | Discharge: 2016-11-23 | Disposition: A | Discharge status: Home / Self Care | Payer: BLUE CROSS/BLUE SHIELD | Attending: Emergency Medicine | Admitting: Emergency Medicine

## 2016-11-23 DIAGNOSIS — K0889 Other specified disorders of teeth and supporting structures: Secondary | ICD-10-CM

## 2016-11-23 MED ORDER — HYDROCODONE-ACETAMINOPHEN 5-325 MG PO TABS: 1.0000 | ORAL_TABLET | Freq: Four times a day (QID) | ORAL | 0 refills | Status: DC | PRN

## 2016-11-23 NOTE — Discharge Instructions (Signed)
Ibuprofen 600 mg every 6 hours as needed for pain.  Hydrocodone as prescribed as needed for pain not relieved with ibuprofen.  Follow-up with your dentist on Thursday as scheduled.

## 2016-11-23 NOTE — ED Notes (Signed)
Pt states she had a "baby tooth" that never fell out. The tooth had become loose and she pulled it out today. Took Tylenol with some relief, but she is still uncomfortable.

## 2016-11-23 NOTE — ED Provider Notes (Signed)
[facility] Provider Note   CSN: [id number] Arrival date & time: 11/22/16  2323     History   Chief Complaint Chief Complaint  Patient presents with  . Dental Pain    HPI Sarah Drake is a 31 y.o. female.  Patient is a 31 year old female with no significant past medical history. She presents with dental pain. She reports a primary tooth that she never lost became loose one week ago and this evening actually fell out. She is describing discomfort in the empty socket. She denies any drainage. She denies any fevers or chills. She has tried taking Tylenol with no relief.   The history is provided by the patient.  Dental Pain   This is a new problem. The current episode started 12 to 24 hours ago. The problem occurs constantly. The problem has not changed since onset.The pain is moderate. She has tried nothing for the symptoms.    Past Medical History:  Diagnosis Date  . Asthma     Patient Active Problem List   Diagnosis Date Noted  . Moderate persistent asthma 05/31/2015  . Allergic rhinitis 05/31/2015    Past Surgical History:  Procedure Laterality Date  . CESAREAN SECTION    . TUBAL LIGATION      OB History    Gravida Para Term Preterm AB Living   1             SAB TAB Ectopic Multiple Live Births                   Home Medications    Prior to Admission medications   Medication Sig Start Date End Date Taking? Authorizing Provider  albuterol (PROVENTIL HFA;VENTOLIN HFA) 108 (90 BASE) MCG/ACT inhaler Inhale 2 puffs into the lungs every 6 (six) hours as needed. For shortness of breath    Historical Provider, MD  albuterol (PROVENTIL HFA;VENTOLIN HFA) 108 (90 Base) MCG/ACT inhaler Inhale 1-2 puffs into the lungs every 6 (six) hours as needed for wheezing or shortness of breath. 09/23/15   April Palumbo, MD  beclomethasone (QVAR) 80 MCG/ACT inhaler Inhale 2 puffs into the lungs 2 (two) times daily.    Historical Provider, MD  guaiFENesin-codeine 100-10  MG/5ML syrup Take 10 mLs by mouth every 6 (six) hours as needed. 09/20/16   Geoffery Lyons, MD  HYDROcodone-acetaminophen (NORCO) 5-325 MG tablet Take 1-2 tablets by mouth every 6 (six) hours as needed. 11/23/16   Geoffery Lyons, MD  ondansetron (ZOFRAN ODT) 8 MG disintegrating tablet  ODT q4 hours prn nausea 09/20/16   Geoffery Lyons, MD  predniSONE (DELTASONE) 20 MG tablet 3 tabs po day one, then 2 po daily x 4 days 09/23/15   April Palumbo, MD    Family History No family history on file.  Social History Social History  Substance Use Topics  . Smoking status: Never Smoker  . Smokeless tobacco: Never Used  . Alcohol use No     Allergies   Patient has no known allergies.   Review of Systems Review of Systems  All other systems reviewed and are negative.    Physical Exam Updated Vital Signs BP 136/78 (BP Location: Right Arm)   Pulse 88   Temp 98.8 F (37.1 C) (Oral)   Resp 20   Ht  (1.651 m)   Wt 231 lb (104.8 kg)   LMP 11/04/2016   SpO2 100%   BMI 38.44 kg/m   Physical Exam  Constitutional: She is oriented to person, place,  and time. She appears well-developed and well-nourished. No distress.  HENT:  Head: Normocephalic and atraumatic.  The right upper canine tooth is noted to be missing. There is an empty socket without purulent drainage or surrounding gingival inflammation.  Neck: Normal range of motion. Neck supple.  Neurological: She is alert and oriented to person, place, and time.  Skin: Skin is warm and dry. She is not diaphoretic.  Nursing note and vitals reviewed.    ED Treatments / Results  Labs (all labs ordered are listed, but only abnormal results are displayed) Labs Reviewed - No data to display  EKG  EKG Interpretation None       Radiology No results found.  Procedures Procedures (including critical care time)  Medications Ordered in ED Medications - No data to display   Initial Impression / Assessment and Plan / ED Course  I have  reviewed the triage vital signs and the nursing notes.  Pertinent labs & imaging results that were available during my care of the patient were reviewed by me and considered in my medical decision making (see chart for details).  I will prescribe a small quantity of hydrocodone. She is to see her dentist this Thursday as scheduled.  Final Clinical Impressions(s) / ED Diagnoses   Final diagnoses:  Pain, dental    New Prescriptions New Prescriptions   HYDROCODONE-ACETAMINOPHEN (NORCO) 5-325 MG TABLET    Take 1-2 tablets by mouth every 6 (six) hours as needed.     Geoffery Lyons, MD 11/23/16 [phone]

## 2016-12-28 ENCOUNTER — Emergency Department (HOSPITAL_BASED_OUTPATIENT_CLINIC_OR_DEPARTMENT_OTHER)
Admission: EM | Admit: 2016-12-28 | Discharge: 2016-12-29 | Disposition: A | Discharge status: Home / Self Care | Payer: BLUE CROSS/BLUE SHIELD | Attending: Emergency Medicine | Admitting: Emergency Medicine

## 2016-12-28 ENCOUNTER — Encounter (HOSPITAL_BASED_OUTPATIENT_CLINIC_OR_DEPARTMENT_OTHER): Payer: Self-pay

## 2016-12-28 DIAGNOSIS — R0602 Shortness of breath: Secondary | ICD-10-CM | POA: Diagnosis present

## 2016-12-28 DIAGNOSIS — J9801 Acute bronchospasm: Secondary | ICD-10-CM | POA: Diagnosis not present

## 2016-12-28 MED ORDER — ALBUTEROL SULFATE (2.5 MG/3ML) 0.083% IN NEBU
5.0000 mg | INHALATION_SOLUTION | Freq: Once | RESPIRATORY_TRACT | Status: AC
  Administered 2016-12-28: 5 mg via RESPIRATORY_TRACT
  Filled 2016-12-28: qty 6

## 2016-12-28 NOTE — ED Notes (Signed)
Ambulated to exam room with NAD, at a fast pace talking on cell phone

## 2016-12-28 NOTE — ED Triage Notes (Signed)
c/o SHOB x 2 hours-states she was unable to find her inhaler-NAD-steady gait

## 2016-12-29 MED ORDER — ALBUTEROL SULFATE HFA 108 (90 BASE) MCG/ACT IN AERS
2.0000 | INHALATION_SPRAY | RESPIRATORY_TRACT | Status: DC | PRN
  Administered 2016-12-29: 2 via RESPIRATORY_TRACT
  Filled 2016-12-29: qty 6.7

## 2016-12-29 MED ORDER — AEROCHAMBER PLUS W/MASK MISC
1.0000 | Freq: Once | Status: AC
  Administered 2016-12-29: 1
  Filled 2016-12-29: qty 1

## 2016-12-29 NOTE — ED Provider Notes (Signed)
   [facility] Provider Note: Sarah Drake Ahaan Zobrist, MD, FACEP  CSN: [id number] MRN: [id number] ARRIVAL: 12/28/16 at 2232 ROOM: [facility]07/[facility]07   CHIEF COMPLAINT  Shortness of Breath   HISTORY OF PRESENT ILLNESS  Sarah Drake is a 31 y.o. female with a history of asthma. She woke up about 2 hours ago with shortness of breath and chest tightness. She could not find her albuterol inhaler which is usually on her nightstand. On arrival here she was mildly tachypneic but without frank wheezes. Symptoms were moderate. She was given an albuterol neb treatment with significant improvement in her symptoms. She has been able to ambulate without difficulty since. She denies chest pain, fever, cough, nausea, vomiting or diarrhea.   Past Medical History:  Diagnosis Date  . Asthma     Past Surgical History:  Procedure Laterality Date  . CESAREAN SECTION    . TUBAL LIGATION      No family history on file.  Social History  Substance Use Topics  . Smoking status: Never Smoker  . Smokeless tobacco: Never Used  . Alcohol use No    Prior to Admission medications   Not on File    Allergies Patient has no known allergies.   REVIEW OF SYSTEMS  Negative except as noted here or in the History of Present Illness.   PHYSICAL EXAMINATION  Initial Vital Signs Blood pressure 113/82, pulse 84, temperature 98.5 F (36.9 C), temperature source Oral, resp. rate (!) 22, height 5\' 5"  (1.651 m), weight 224 lb 8 oz (101.8 kg), last menstrual period 12/07/2016, SpO2 100 %.  Examination General: Well-developed, well-nourished female in no acute distress; appearance consistent with age of record HENT: normocephalic; atraumatic Eyes: pupils equal, round and reactive to light; extraocular muscles intact Neck: supple Heart: regular rate and rhythm Lungs: clear to auscultation bilaterally Abdomen: soft; nondistended; nontender; bowel sounds present Extremities: No deformity; full range of  motion Neurologic: Awake, alert and oriented; motor function intact in all extremities and symmetric; no facial droop Skin: Warm and dry Psychiatric: Normal mood and affect   RESULTS  Summary of this visit's results, reviewed by myself:   EKG Interpretation  Date/Time:    Ventricular Rate:    PR Interval:    QRS Duration:   QT Interval:    QTC Calculation:   R Axis:     Text Interpretation:        Laboratory Studies: No results found for this or any previous visit (from the past 24 hour(s)). Imaging Studies: No results found.  ED COURSE  Nursing notes and initial vitals signs, including pulse oximetry, reviewed.  Vitals:   12/28/16 2240 12/28/16 2251  BP: 113/82   Pulse: 84   Resp: (!) 22   Temp: 98.5 F (36.9 C)   TempSrc: Oral   SpO2: 100% 100%  Weight: 224 lb 8 oz (101.8 kg)   Height: 5\' 5"  (1.651 m)     PROCEDURES    ED DIAGNOSES     ICD-9-CM ICD-10-CM   1. Acute bronchospasm 519.11 J98.01        Sarah Drake, Sarah RuizJohn, MD 12/29/16 [phone]

## 2017-11-01 ENCOUNTER — Ambulatory Visit (INDEPENDENT_AMBULATORY_CARE_PROVIDER_SITE_OTHER): Payer: BLUE CROSS/BLUE SHIELD | Admitting: Family Medicine

## 2017-11-01 ENCOUNTER — Encounter: Payer: Self-pay | Admitting: Family Medicine

## 2017-11-01 VITALS — BP 110/68 | HR 87 | Temp 98.0°F | Resp 16 | Ht 65.0 in | Wt 228.0 lb

## 2017-11-01 DIAGNOSIS — J302 Other seasonal allergic rhinitis: Secondary | ICD-10-CM | POA: Insufficient documentation

## 2017-11-01 DIAGNOSIS — J4540 Moderate persistent asthma, uncomplicated: Secondary | ICD-10-CM | POA: Diagnosis not present

## 2017-11-01 DIAGNOSIS — J3089 Other allergic rhinitis: Secondary | ICD-10-CM

## 2017-11-01 MED ORDER — MONTELUKAST SODIUM 10 MG PO TABS: ORAL_TABLET | ORAL | 5 refills | Status: DC

## 2017-11-01 MED ORDER — OLOPATADINE HCL 0.2 % OP SOLN: OPHTHALMIC | 5 refills | Status: DC

## 2017-11-01 MED ORDER — FLUTICASONE PROPIONATE HFA 110 MCG/ACT IN AERO: INHALATION_SPRAY | RESPIRATORY_TRACT | 5 refills | Status: DC

## 2017-11-01 MED ORDER — ALBUTEROL SULFATE HFA 108 (90 BASE) MCG/ACT IN AERS
2.0000 | INHALATION_SPRAY | RESPIRATORY_TRACT | 3 refills | Status: DC | PRN
Start: 2017-11-01 — End: 2018-10-14

## 2017-11-01 MED ORDER — FLUTICASONE PROPIONATE 50 MCG/ACT NA SUSP: NASAL | 5 refills | Status: DC

## 2017-11-01 NOTE — Patient Instructions (Addendum)
Asthma Begin montelukast 10 mg once a day at bedtime to prevent shortness of breath, cough, and wheeze Flovent 110- 2 puffs once a day with a spacer to prevent cough and wheeze. Rinse and spit after each use. ProAir 2 puffs every 4 hours as needed for cough and wheeze Prednisone 10 mg. Take 2 tablets once a day for 4 days, then take 1 tablet for 1  day, then stop.  Allergic rhinitis/allergic conjunctivitis Begin Zyrtec 10 mg once a day as needed for runny nose or sneezing Consider nasal saline rinses before any medicated nasal sprays Flonase nasal spray 2 sprays per nostril once a day as needed for a runny nose Begin olopatadine 0.2% eye drops- one drop in each eye as needed for red, itchy eyes  Call me if this treatment plan is not working for you  Follow up in 3 months or sooner if needed

## 2017-11-01 NOTE — Progress Notes (Signed)
[address] [place] [address] Dept: [phone]  FOLLOW UP NOTE  Patient ID: Sarah Drake, female    DOB: Nov 07, 1985  Age: 32 y.o. MRN: [id number] Date of Office Visit: 11/01/2017  Assessment  Chief Complaint: Asthma  HPI Sarah Drake is a 32 year old female who presents to the clinic today for a follow-up visit.  She was last seen in this clinic on 03/28/2015 by Dr. Beaulah DinningBardelas for evaluation of moderate persistent asthma and allergic rhinitis.  At that time she was continued on Qvar 80-2 puffs twice a day, cetirizine 10 mg once a day, and ProAir as needed.  At today's visit, she reports her asthma has not been well controlled.  She reports she has shortness of breath and wheezing with activity.  She reports factors that increase her shortness of breath are activity such as going up and down stairs and hills and hot or cold weather.  She is currently using ProAir at least 3 times a day when she is at work and 2 times a day when she is at home.  She currently works at [employer]. She denies cough and nighttime awakenings from asthma symptoms.  Allergic rhinitis is reported is not well controlled with symptoms of nasal congestion as well as runny nose.  She currently takes Claritin at nighttime with moderate relief of symptoms.  If she is out of Claritin, she takes Benadryl which makes her sleepy into the following day.  She does not currently use any nasal steroid sprays or saline nasal rinses.  She reports her eyes are frequently red and itchy.  Her current medications are listed in the chart.   Drug Allergies:  No Known Allergies  Physical Exam: BP 110/68   Pulse 87   Temp 98 F (36.7 C) (Oral)   Resp 16   Ht 5\' 5"  (1.651 m)   Wt 228 lb (103.4 kg)   SpO2 98%   BMI 37.94 kg/m    Physical Exam  Constitutional: She is oriented to person, place, and time. She appears well-developed and well-nourished.  HENT:  Head: Normocephalic and atraumatic.  Right Ear:  External ear normal.  Left Ear: External ear normal.  Bilateral nares erythematous and edematous with clear nasal drainage noted.  Pharynx normal.  Eyes normal.  Ears with copious cerumen noted.  Eyes: Conjunctivae are normal.  Neck: Normal range of motion. Neck supple.  Cardiovascular: Normal rate, regular rhythm and normal heart sounds.  S1-S2 normal.  Regular heart rate and rhythm.  No murmur noted.  Pulmonary/Chest: Effort normal and breath sounds normal.  Lungs clear to auscultation  Musculoskeletal: Normal range of motion.  Neurological: She is alert and oriented to person, place, and time.  Skin: Skin is warm and dry.  Psychiatric: She has a normal mood and affect. Her behavior is normal. Judgment and thought content normal.    Diagnostics: FVC 3.75, FEV1 2.98.  Predicted FVC 3.31.  Predicted FEV1 2.81.  Spirometry reveals normal ventilatory function.  Assessment and Plan: 1. Moderate persistent asthma without complication   2. Seasonal and perennial allergic rhinitis     Meds ordered this encounter  Medications  . montelukast (SINGULAIR) 10 MG tablet    Sig: One tablet at bedtime to prevent shortness of breath, cough and wheeze.    Dispense:  30 tablet    Refill:  5  . fluticasone (FLOVENT HFA) 110 MCG/ACT inhaler    Sig: Two puffs once a day with spacer to prevent cough or wheeze.  Rinse, gargle and spit after use.    Dispense:  12 g    Refill:  5  . albuterol (PROAIR HFA) 108 (90 Base) MCG/ACT inhaler    Sig: Inhale 2 puffs into the lungs every 4 (four) hours as needed for wheezing or shortness of breath.    Dispense:  1 Inhaler    Refill:  3  . fluticasone (FLONASE) 50 MCG/ACT nasal spray    Sig: Two sprays each nostril once a day for nasal congestion or drainage.    Dispense:  16 g    Refill:  5  . Olopatadine HCl 0.2 % SOLN    Sig: One drop each eye once a day as needed for red itchy eyes.    Dispense:  2.5 mL    Refill:  5    Patient Instructions   Asthma Begin montelukast 10 mg once a day at bedtime to prevent shortness of breath, cough, and wheeze Flovent 110- 2 puffs once a day with a spacer to prevent cough and wheeze. Rinse and spit after each use. ProAir 2 puffs every 4 hours as needed for cough and wheeze Prednisone 10 mg. Take 2 tablets once a day for 4 days, then take 1 tablet for 1  day, then stop.  Allergic rhinitis/allergic conjunctivitis Begin Zyrtec 10 mg once a day as needed for runny nose or sneezing Consider nasal saline rinses before any medicated nasal sprays Flonase nasal spray 2 sprays per nostril once a day as needed for a runny nose Begin olopatadine 0.2% eye drops- one drop in each eye as needed for red, itchy eyes  Call me if this treatment plan is not working for you  Follow up in 3 months or sooner if needed   Return in about 3 months (around 02/01/2018), or if symptoms worsen or fail to improve.   Sarah Drake was seen in the clinic with Dr. Beaulah Dinning today. Thank you for the opportunity to care for this patient.  Please do not hesitate to contact me with questions.  Thermon Leyland, FNP [facility]  Thank you for the opportunity to care for this patient.  Please do not hesitate to contact me with questions.  Tonette Bihari, M.D.  [facility] [address] [place], [place] [address] [phone]

## 2018-05-10 ENCOUNTER — Ambulatory Visit: Payer: BLUE CROSS/BLUE SHIELD | Admitting: Family Medicine

## 2018-07-09 ENCOUNTER — Inpatient Hospital Stay (HOSPITAL_BASED_OUTPATIENT_CLINIC_OR_DEPARTMENT_OTHER)
Admission: EM | Admit: 2018-07-09 | Discharge: 2018-07-13 | DRG: 419 | Disposition: A | Discharge status: Home / Self Care | Payer: BLUE CROSS/BLUE SHIELD | Attending: Surgery | Admitting: Surgery

## 2018-07-09 ENCOUNTER — Encounter (HOSPITAL_COMMUNITY): Payer: BLUE CROSS/BLUE SHIELD

## 2018-07-09 ENCOUNTER — Encounter (HOSPITAL_BASED_OUTPATIENT_CLINIC_OR_DEPARTMENT_OTHER): Payer: Self-pay | Admitting: Emergency Medicine

## 2018-07-09 ENCOUNTER — Emergency Department (HOSPITAL_BASED_OUTPATIENT_CLINIC_OR_DEPARTMENT_OTHER): Payer: BLUE CROSS/BLUE SHIELD

## 2018-07-09 ENCOUNTER — Other Ambulatory Visit: Payer: Self-pay

## 2018-07-09 DIAGNOSIS — K8018 Calculus of gallbladder with other cholecystitis without obstruction: Secondary | ICD-10-CM

## 2018-07-09 DIAGNOSIS — K5900 Constipation, unspecified: Secondary | ICD-10-CM | POA: Diagnosis present

## 2018-07-09 DIAGNOSIS — K8063 Calculus of gallbladder and bile duct with acute cholecystitis with obstruction: Principal | ICD-10-CM | POA: Diagnosis present

## 2018-07-09 DIAGNOSIS — K819 Cholecystitis, unspecified: Secondary | ICD-10-CM

## 2018-07-09 DIAGNOSIS — Z9889 Other specified postprocedural states: Secondary | ICD-10-CM | POA: Diagnosis not present

## 2018-07-09 DIAGNOSIS — R932 Abnormal findings on diagnostic imaging of liver and biliary tract: Secondary | ICD-10-CM | POA: Diagnosis not present

## 2018-07-09 DIAGNOSIS — E669 Obesity, unspecified: Secondary | ICD-10-CM | POA: Diagnosis present

## 2018-07-09 DIAGNOSIS — K839 Disease of biliary tract, unspecified: Secondary | ICD-10-CM | POA: Diagnosis not present

## 2018-07-09 DIAGNOSIS — R1010 Upper abdominal pain, unspecified: Secondary | ICD-10-CM | POA: Diagnosis present

## 2018-07-09 DIAGNOSIS — E860 Dehydration: Secondary | ICD-10-CM | POA: Diagnosis present

## 2018-07-09 DIAGNOSIS — K915 Postcholecystectomy syndrome: Secondary | ICD-10-CM | POA: Diagnosis not present

## 2018-07-09 DIAGNOSIS — Y838 Other surgical procedures as the cause of abnormal reaction of the patient, or of later complication, without mention of misadventure at the time of the procedure: Secondary | ICD-10-CM | POA: Diagnosis not present

## 2018-07-09 DIAGNOSIS — K8000 Calculus of gallbladder with acute cholecystitis without obstruction: Secondary | ICD-10-CM | POA: Diagnosis not present

## 2018-07-09 DIAGNOSIS — J45909 Unspecified asthma, uncomplicated: Secondary | ICD-10-CM | POA: Diagnosis present

## 2018-07-09 DIAGNOSIS — Z6834 Body mass index (BMI) 34.0-34.9, adult: Secondary | ICD-10-CM | POA: Diagnosis not present

## 2018-07-09 DIAGNOSIS — K3189 Other diseases of stomach and duodenum: Secondary | ICD-10-CM | POA: Diagnosis present

## 2018-07-09 DIAGNOSIS — K8041 Calculus of bile duct with cholecystitis, unspecified, with obstruction: Secondary | ICD-10-CM | POA: Diagnosis not present

## 2018-07-09 DIAGNOSIS — K8010 Calculus of gallbladder with chronic cholecystitis without obstruction: Secondary | ICD-10-CM | POA: Diagnosis present

## 2018-07-09 DIAGNOSIS — K838 Other specified diseases of biliary tract: Secondary | ICD-10-CM | POA: Diagnosis not present

## 2018-07-09 DIAGNOSIS — K2900 Acute gastritis without bleeding: Secondary | ICD-10-CM | POA: Diagnosis not present

## 2018-07-09 DIAGNOSIS — K8050 Calculus of bile duct without cholangitis or cholecystitis without obstruction: Secondary | ICD-10-CM

## 2018-07-09 DIAGNOSIS — R1084 Generalized abdominal pain: Secondary | ICD-10-CM | POA: Diagnosis not present

## 2018-07-09 LAB — CBC
HEMATOCRIT: 36.7 % (ref 36.0–46.0)
Hemoglobin: 11.3 g/dL — ABNORMAL LOW (ref 12.0–15.0)
MCH: 30 pg (ref 26.0–34.0)
MCHC: 30.8 g/dL (ref 30.0–36.0)
MCV: 97.3 fL (ref 80.0–100.0)
PLATELETS: 175 10*3/uL (ref 150–400)
RBC: 3.77 MIL/uL — ABNORMAL LOW (ref 3.87–5.11)
RDW: 13.2 % (ref 11.5–15.5)
WBC: 7.5 10*3/uL (ref 4.0–10.5)
nRBC: 0 % (ref 0.0–0.2)

## 2018-07-09 LAB — COMPREHENSIVE METABOLIC PANEL
ALBUMIN: 4.1 g/dL (ref 3.5–5.0)
ALT: 17 U/L (ref 0–44)
ANION GAP: 10 (ref 5–15)
AST: 26 U/L (ref 15–41)
Alkaline Phosphatase: 33 U/L — ABNORMAL LOW (ref 38–126)
BILIRUBIN TOTAL: 0.3 mg/dL (ref 0.3–1.2)
BUN: 12 mg/dL (ref 6–20)
CO2: 21 mmol/L — ABNORMAL LOW (ref 22–32)
Calcium: 9.3 mg/dL (ref 8.9–10.3)
Chloride: 109 mmol/L (ref 98–111)
Creatinine, Ser: 0.56 mg/dL (ref 0.44–1.00)
GFR calc Af Amer: >60 mL/min (ref >60)
GFR calc non Af Amer: >60 mL/min (ref >60)
GLUCOSE: 133 mg/dL — AB (ref 70–99)
POTASSIUM: 3.7 mmol/L (ref 3.5–5.1)
Sodium: 140 mmol/L (ref 135–145)
TOTAL PROTEIN: 7.7 g/dL (ref 6.5–8.1)

## 2018-07-09 LAB — URINALYSIS, MICROSCOPIC (REFLEX)

## 2018-07-09 LAB — URINALYSIS, ROUTINE W REFLEX MICROSCOPIC
Bilirubin Urine: NEGATIVE
Glucose, UA: NEGATIVE mg/dL
KETONES UR: NEGATIVE mg/dL
NITRITE: NEGATIVE
PH: 5.5 (ref 5.0–8.0)
PROTEIN: 30 mg/dL — AB
Specific Gravity, Urine: >1.03 — ABNORMAL HIGH (ref 1.005–1.030)

## 2018-07-09 LAB — PREGNANCY, URINE: Preg Test, Ur: NEGATIVE

## 2018-07-09 LAB — LIPASE, BLOOD: LIPASE: 24 U/L (ref 11–51)

## 2018-07-09 MED ORDER — ALBUTEROL SULFATE HFA 108 (90 BASE) MCG/ACT IN AERS: 2.0000 | INHALATION_SPRAY | Freq: Four times a day (QID) | RESPIRATORY_TRACT | Status: DC | PRN

## 2018-07-09 MED ORDER — ALBUTEROL SULFATE (2.5 MG/3ML) 0.083% IN NEBU: 3.0000 mL | INHALATION_SOLUTION | RESPIRATORY_TRACT | Status: DC | PRN

## 2018-07-09 MED ORDER — MORPHINE SULFATE (PF) 2 MG/ML IV SOLN
1.0000 mg | INTRAVENOUS | Status: DC | PRN
  Administered 2018-07-10: 2 mg via INTRAVENOUS
  Filled 2018-07-09: qty 1

## 2018-07-09 MED ORDER — HYDROMORPHONE HCL 1 MG/ML IJ SOLN
1.0000 mg | Freq: Once | INTRAMUSCULAR | Status: AC
  Administered 2018-07-09: 1 mg via INTRAVENOUS
  Filled 2018-07-09: qty 1

## 2018-07-09 MED ORDER — ONDANSETRON 4 MG PO TBDP: 4.0000 mg | ORAL_TABLET | Freq: Four times a day (QID) | ORAL | Status: DC | PRN

## 2018-07-09 MED ORDER — MONTELUKAST SODIUM 10 MG PO TABS
10.0000 mg | ORAL_TABLET | Freq: Every day | ORAL | Status: DC
  Administered 2018-07-09: 10 mg via ORAL
  Filled 2018-07-09: qty 1

## 2018-07-09 MED ORDER — BUDESONIDE 0.25 MG/2ML IN SUSP: 0.2500 mg | Freq: Every day | RESPIRATORY_TRACT | Status: DC | PRN

## 2018-07-09 MED ORDER — TRAMADOL HCL 50 MG PO TABS
50.0000 mg | ORAL_TABLET | Freq: Four times a day (QID) | ORAL | Status: DC | PRN
  Administered 2018-07-10: 50 mg via ORAL
  Filled 2018-07-09: qty 1

## 2018-07-09 MED ORDER — SODIUM CHLORIDE 0.9 % IV SOLN
2.0000 g | INTRAVENOUS | Status: DC
  Filled 2018-07-09: qty 20

## 2018-07-09 MED ORDER — OXYCODONE HCL 5 MG PO TABS: 5.0000 mg | ORAL_TABLET | ORAL | Status: DC | PRN

## 2018-07-09 MED ORDER — SODIUM CHLORIDE 0.9 % IV BOLUS
1000.0000 mL | Freq: Once | INTRAVENOUS | Status: AC
  Administered 2018-07-09: 1000 mL via INTRAVENOUS

## 2018-07-09 MED ORDER — ONDANSETRON HCL 4 MG/2ML IJ SOLN
4.0000 mg | Freq: Four times a day (QID) | INTRAMUSCULAR | Status: DC | PRN
  Administered 2018-07-10: 4 mg via INTRAVENOUS
  Filled 2018-07-09: qty 2

## 2018-07-09 MED ORDER — SODIUM CHLORIDE 0.9 % IV SOLN
INTRAVENOUS | Status: DC | PRN
  Administered 2018-07-09: 250 mL via INTRAVENOUS

## 2018-07-09 MED ORDER — KCL IN DEXTROSE-NACL 20-5-0.45 MEQ/L-%-% IV SOLN
INTRAVENOUS | Status: DC
  Administered 2018-07-09 – 2018-07-10 (×2): via INTRAVENOUS
  Filled 2018-07-09 (×2): qty 1000

## 2018-07-09 MED ORDER — FLUTICASONE PROPIONATE HFA 110 MCG/ACT IN AERO: 2.0000 | INHALATION_SPRAY | Freq: Every day | RESPIRATORY_TRACT | Status: DC | PRN

## 2018-07-09 MED ORDER — DIPHENHYDRAMINE HCL 25 MG PO CAPS: 50.0000 mg | ORAL_CAPSULE | Freq: Four times a day (QID) | ORAL | Status: DC | PRN

## 2018-07-09 MED ORDER — SODIUM CHLORIDE 0.9 % IV SOLN
2.0000 g | Freq: Once | INTRAVENOUS | Status: AC
  Administered 2018-07-09: 2 g via INTRAVENOUS
  Filled 2018-07-09: qty 20

## 2018-07-09 MED ORDER — ONDANSETRON HCL 4 MG/2ML IJ SOLN
4.0000 mg | Freq: Once | INTRAMUSCULAR | Status: AC
  Administered 2018-07-09: 4 mg via INTRAVENOUS
  Filled 2018-07-09: qty 2

## 2018-07-09 MED ORDER — ALUM & MAG HYDROXIDE-SIMETH 200-200-20 MG/5ML PO SUSP
30.0000 mL | Freq: Four times a day (QID) | ORAL | Status: DC | PRN
  Administered 2018-07-09: 30 mL via ORAL
  Filled 2018-07-09: qty 30

## 2018-07-09 NOTE — ED Triage Notes (Signed)
Patient states that she has had N/V since yesterday  - she reports that she has pain to her stomach when she throws up

## 2018-07-09 NOTE — ED Notes (Signed)
Alexia FreestonePatty, RN [facility]

## 2018-07-09 NOTE — ED Notes (Signed)
Asked pt if she could urinate for urine sample- pt states she is still not able to

## 2018-07-09 NOTE — ED Provider Notes (Signed)
[facility]-EMERGENCY DEPT Provider Note   CSN: [id number] Arrival date & time: 07/09/18  1018     History   Chief Complaint Chief Complaint  Patient presents with  . Abdominal Pain    HPI Belva Bertinamika Borum is a 32 y.o. female.  HPI   [facility] to be evaluated by general surgery for cholelithiasis with possible cholecystitis.  At this time she is comfortable.  She is n.p.o. at this time.  She is awaiting general surgery consultation.  Past Medical History:  Diagnosis Date  . Asthma     Patient Active Problem List   Diagnosis Date Noted  . Seasonal and perennial allergic rhinitis 11/01/2017  . Moderate persistent asthma without complication 05/31/2015  . Allergic rhinitis 05/31/2015    Past Surgical History:  Procedure Laterality Date  . CESAREAN SECTION    . TUBAL LIGATION       OB History    Gravida  1   Para      Term      Preterm      AB      Living        SAB      TAB      Ectopic      Multiple      Live Births               Home Medications    Prior to Admission medications   Medication Sig Start Date End Date Taking? Authorizing Provider  albuterol (PROAIR HFA) 108 (90 Base) MCG/ACT inhaler Inhale into the lungs every 6 (six) hours as needed for wheezing or shortness of breath.    [provider]  albuterol (PROAIR HFA) 108 (90 Base) MCG/ACT inhaler Inhale 2 puffs into the lungs every 4 (four) hours as needed for wheezing or shortness of breath. 11/01/17   Ambs, Norvel RichardsAnne M, FNP  diphenhydrAMINE (BENADRYL) 50 MG capsule Take 50 mg by mouth every 6 (six) hours as needed.    [provider]  fluticasone (FLONASE) 50 MCG/ACT nasal spray Two sprays each nostril once a day for nasal congestion or drainage. 11/01/17   Hetty BlendAmbs, Anne M, FNP  fluticasone (FLOVENT HFA) 110 MCG/ACT inhaler Two puffs once a day with spacer to prevent cough or wheeze.  Rinse, gargle and spit after use.  11/01/17   Ambs, Norvel RichardsAnne M, FNP  montelukast (SINGULAIR) 10 MG tablet One tablet at bedtime to prevent shortness of breath, cough and wheeze. 11/01/17   Hetty BlendAmbs, Anne M, FNP  Olopatadine HCl 0.2 % SOLN One drop each eye once a day as needed for red itchy eyes. 11/01/17   Hetty BlendAmbs, Anne M, FNP    Family History History reviewed. No pertinent family history.  Social History Social History   Tobacco Use  . Smoking status: Never Smoker  . Smokeless tobacco: Never Used  Substance Use Topics  . Alcohol use: No  . Drug use: Yes    Types: Marijuana    Comment: daily     Allergies   Patient has no known allergies.     Physical Exam Updated Vital Signs BP 115/73   Pulse 81   Temp 98.1 F (36.7 C) (Oral)   Resp 18   Ht 5\' 5"  (1.651 m)   Wt 93 kg   LMP 05/24/2018   SpO2 100%   BMI 34.11 kg/m   Physical Exam  Constitutional: She is oriented to person, place, and time. She appears well-developed and  well-nourished. She does not appear ill.  HENT:  Head: Atraumatic.  Cardiovascular: Normal rate.  Pulmonary/Chest: Effort normal.  Neurological: She is oriented to person, place, and time.  Skin: Skin is dry.  Psychiatric: She has a normal mood and affect. Her behavior is normal.     ED Treatments / Results  Labs (all labs ordered are listed, but only abnormal results are displayed) Labs Reviewed  CBC - Abnormal; Notable for the following components:      Result Value   RBC 3.77 (*)    Hemoglobin 11.3 (*)    All other components within normal limits  COMPREHENSIVE METABOLIC PANEL - Abnormal; Notable for the following components:   CO2 21 (*)    Glucose, Bld 133 (*)    Alkaline Phosphatase 33 (*)    All other components within normal limits  URINALYSIS, ROUTINE W REFLEX MICROSCOPIC - Abnormal; Notable for the following components:   APPearance HAZY (*)    Specific Gravity, Urine >1.030 (*)    Hgb urine dipstick TRACE (*)    Protein, ur 30 (*)    Leukocytes, UA TRACE (*)     All other components within normal limits  URINALYSIS, MICROSCOPIC (REFLEX) - Abnormal; Notable for the following components:   Bacteria, UA MANY (*)    Trichomonas, UA PRESENT (*)    All other components within normal limits  LIPASE, BLOOD  PREGNANCY, URINE  TROPONIN I    EKG None  Radiology US Abdomen Limited Ruq  Result Date: 07/09/2018 CLINICAL DATA:  32 year old presenting with epigastric abdominal pain, nausea and vomiting for the past 2 days that began after a large meal. EXAM: ULTRASOUND ABDOMEN LIMITED RIGHT UPPER QUADRANT COMPARISON:  None. FINDINGS: Gallbladder: Multiple shadowing gallstones within the gallbladder, the largest on the order of 2 cm in size. Borderline gallbladder wall thickening at 4 mm. Positive sonographic Murphy sign during the examination according to the ultrasound technologist. Common bile duct: Diameter: Approximately 3 mm. Liver: Normal size and echotexture without focal parenchymal abnormality. Portal vein is patent on color Doppler imaging with normal direction of blood flow towards the liver. IMPRESSION: 1. Cholelithiasis. Borderline gallbladder wall thickening and positive sonographic Murphy sign likely indicates acute cholecystitis. 2. Otherwise normal examination. Electronically Signed   By: Hulan Saas M.D.   On: 07/09/2018 12:32    Procedures Procedures (including critical care time)  Medications Ordered in ED Medications  0.9 %  sodium chloride infusion ( Intravenous Stopped 07/09/18 1431)  ondansetron (ZOFRAN) injection 4 mg (4 mg Intravenous Given 07/09/18 1114)  sodium chloride 0.9 % bolus 1,000 mL ( Intravenous Stopped 07/09/18 1218)  HYDROmorphone (DILAUDID) injection 1 mg (1 mg Intravenous Given 07/09/18 1114)  cefTRIAXone (ROCEPHIN) 2 g in sodium chloride 0.9 % 100 mL IVPB ( Intravenous Stopped 07/09/18 1421)     Initial Impression / Assessment and Plan / ED Course  I have reviewed the triage vital signs and the nursing  notes.  Pertinent labs & imaging results that were available during my care of the patient were reviewed by me and considered in my medical decision making (see chart for details).      Patient Vitals for the past 24 hrs:  BP Temp Temp src Pulse Resp SpO2 Height Weight  07/09/18 1435 115/73 98.1 F (36.7 C) Oral 81 - 100 % - -  07/09/18 1432 - 98.8 F (37.1 C) - 76 18 100 % - -  07/09/18 1227 (!) 108/50 - - 76 18 100 % - -  07/09/18 1027 121/75 98.5 F (36.9 C) Oral 66 18 100 % - -  07/09/18 1025 - - - - - - 5\' 5"  (1.651 m) 93 kg    Screening Exam done prior to arrival of Surgeon   Medical Decision Making: Gall Bladder disease. Surgical consult with Dr. Ezzard Standing for disposition  CRITICAL CARE- no Performed by: Mancel Bale   Nursing Notes Reviewed/ Care Coordinated Applicable Imaging Reviewed Interpretation of Laboratory Data incorporated into ED treatment    Final Clinical Impressions(s) / ED Diagnoses   Final diagnoses:  Upper abdominal pain  Calculus of gallbladder with cholecystitis of other acuity without obstruction    ED Discharge Orders    None       Mancel Bale, MD 07/10/18 1355

## 2018-07-09 NOTE — ED Provider Notes (Signed)
[facility] EMERGENCY DEPARTMENT Provider Note   CSN: [id number] Arrival date & time: 07/09/18  1018     History   Chief Complaint Chief Complaint  Patient presents with  . Abdominal Pain    HPI Ruthel Martine is a 32 y.o. female.  HPI Patient is a 32 year old female reports ongoing upper abdominal pain since last night.  Her pain came on acutely.  She began having nausea and vomiting throughout the night.  She awoke this morning continued to feel nauseated and having upper abdominal discomfort.  No prior history of gallstones.  She was brought to the emergency department by her family for further evaluation.  No fevers or chills.  No lower abdominal pain.  No other complaints at this time.  Pain is moderate to severe in severity.  She feels weak and dehydrated.   Past Medical History:  Diagnosis Date  . Asthma     Patient Active Problem List   Diagnosis Date Noted  . Seasonal and perennial allergic rhinitis 11/01/2017  . Moderate persistent asthma without complication 05/31/2015  . Allergic rhinitis 05/31/2015    Past Surgical History:  Procedure Laterality Date  . CESAREAN SECTION    . TUBAL LIGATION       OB History    Gravida  1   Para      Term      Preterm      AB      Living        SAB      TAB      Ectopic      Multiple      Live Births               Home Medications    Prior to Admission medications   Medication Sig Start Date End Date Taking? Authorizing Provider  albuterol (PROAIR HFA) 108 (90 Base) MCG/ACT inhaler Inhale into the lungs every 6 (six) hours as needed for wheezing or shortness of breath.    [provider]  albuterol (PROAIR HFA) 108 (90 Base) MCG/ACT inhaler Inhale 2 puffs into the lungs every 4 (four) hours as needed for wheezing or shortness of breath. 11/01/17   Ambs, Norvel Richards, FNP  diphenhydrAMINE (BENADRYL) 50 MG capsule Take 50 mg by mouth every 6 (six) hours as needed.    [provider]  fluticasone (FLONASE) 50 MCG/ACT nasal spray Two sprays each nostril once a day for nasal congestion or drainage. 11/01/17   Hetty Blend, FNP  fluticasone (FLOVENT HFA) 110 MCG/ACT inhaler Two puffs once a day with spacer to prevent cough or wheeze.  Rinse, gargle and spit after use. 11/01/17   Ambs, Norvel Richards, FNP  montelukast (SINGULAIR) 10 MG tablet One tablet at bedtime to prevent shortness of breath, cough and wheeze. 11/01/17   Hetty Blend, FNP  Olopatadine HCl 0.2 % SOLN One drop each eye once a day as needed for red itchy eyes. 11/01/17   Hetty Blend, FNP    Family History History reviewed. No pertinent family history.  Social History Social History   Tobacco Use  . Smoking status: Never Smoker  . Smokeless tobacco: Never Used  Substance Use Topics  . Alcohol use: No  . Drug use: Yes    Types: Marijuana    Comment: daily     Allergies   Patient has no known allergies.   Review of Systems Review of Systems  All other systems reviewed and are negative.  Physical Exam Updated Vital Signs BP (!) 108/50   Pulse 76   Temp 98.5 F (36.9 C) (Oral)   Resp 18   Ht 5\' 5"  (1.651 m)   Wt 93 kg   LMP 05/24/2018   SpO2 100%   BMI 34.11 kg/m   Physical Exam  Constitutional: She is oriented to person, place, and time. She appears well-developed and well-nourished. No distress.  HENT:  Head: Normocephalic and atraumatic.  Eyes: EOM are normal.  Neck: Normal range of motion.  Cardiovascular: Normal rate, regular rhythm and normal heart sounds.  Pulmonary/Chest: Effort normal and breath sounds normal.  Abdominal: Soft. She exhibits no distension.  Mild epigastric and right upper quadrant tenderness without guarding or rebound.  Musculoskeletal: Normal range of motion.  Neurological: She is alert and oriented to person, place, and time.  Skin: Skin is warm and dry.  Psychiatric: She has a normal mood and affect. Judgment normal.  Nursing note and vitals  reviewed.    ED Treatments / Results  Labs (all labs ordered are listed, but only abnormal results are displayed) Labs Reviewed  CBC - Abnormal; Notable for the following components:      Result Value   RBC 3.77 (*)    Hemoglobin 11.3 (*)    All other components within normal limits  COMPREHENSIVE METABOLIC PANEL - Abnormal; Notable for the following components:   CO2 21 (*)    Glucose, Bld 133 (*)    Alkaline Phosphatase 33 (*)    All other components within normal limits  LIPASE, BLOOD  URINALYSIS, ROUTINE W REFLEX MICROSCOPIC  PREGNANCY, URINE    EKG None  Radiology [place] Abdomen Limited Ruq  Result Date: 07/09/2018 CLINICAL DATA:  32 year old presenting with epigastric abdominal pain, nausea and vomiting for the past 2 days that began after a large meal. EXAM: ULTRASOUND ABDOMEN LIMITED RIGHT UPPER QUADRANT COMPARISON:  None. FINDINGS: Gallbladder: Multiple shadowing gallstones within the gallbladder, the largest on the order of 2 cm in size. Borderline gallbladder wall thickening at 4 mm. Positive sonographic Murphy sign during the examination according to the ultrasound technologist. Common bile duct: Diameter: Approximately 3 mm. Liver: Normal size and echotexture without focal parenchymal abnormality. Portal vein is patent on color Doppler imaging with normal direction of blood flow towards the liver. IMPRESSION: 1. Cholelithiasis. Borderline gallbladder wall thickening and positive sonographic Murphy sign likely indicates acute cholecystitis. 2. Otherwise normal examination. Electronically Signed   By: Hulan Saashomas  Lawrence M.D.   On: 07/09/2018 12:32    Procedures Procedures (including critical care time)  Medications Ordered in ED Medications  cefTRIAXone (ROCEPHIN) 2 g in sodium chloride 0.9 % 100 mL IVPB (2 g Intravenous New Bag/Given 07/09/18 1336)  0.9 %  sodium chloride infusion (250 mLs Intravenous New Bag/Given 07/09/18 1334)  ondansetron (ZOFRAN) injection 4 mg (4  mg Intravenous Given 07/09/18 1114)  sodium chloride 0.9 % bolus 1,000 mL ( Intravenous Stopped 07/09/18 1218)  HYDROmorphone (DILAUDID) injection 1 mg (1 mg Intravenous Given 07/09/18 1114)     Initial Impression / Assessment and Plan / ED Course  I have reviewed the triage vital signs and the nursing notes.  Pertinent labs & imaging results that were available during my care of the patient were reviewed by me and considered in my medical decision making (see chart for details).    LFTs and white blood cell count are normal.  Ultrasound demonstrates cholelithiasis.  Gallbladder wall is 4 mm.  No pericholecystic fluid.  She continues to  have just very mild epigastric tenderness on examination.  Radiology report concerning for developing cholecystitis.  Is unclear if this is either developing cholecystitis versus pain associated with cholelithiasis.  Her white blood cell count and lack of significant tenderness would argue for no evidence of cholecystitis however this may be developing given her persistent symptoms since last night.  I discussed the case with Dr. Ezzard Standing of general surgery who will see the patient in consultation in the [facility].  Patient to be transferred via private vehicle.  Final Clinical Impressions(s) / ED Diagnoses   Final diagnoses:  Upper abdominal pain  Calculus of gallbladder with cholecystitis of other acuity without obstruction    ED Discharge Orders    None       Azalia Bilis, MD 07/09/18 1344

## 2018-07-09 NOTE — ED Notes (Signed)
I have just phoned report to Wantagh[place], RN on [address]. Will transport shortly.

## 2018-07-09 NOTE — ED Notes (Signed)
She has [facility] been [facility] by [facility]. She tells us she is to "have my gallbladder out".

## 2018-07-09 NOTE — ED Notes (Signed)
ED TO INPATIENT HANDOFF REPORT  Name/Age/Gender Sarah Drake 32 y.o. female  Code Status   Home/SNF/Other Home  Chief Complaint ABD Pain, Vomiting  Level of Care/Admitting Diagnosis ED Disposition    ED Disposition Condition [facility] Area: [place] [[id number]]  Level of Care: Med-Surg [16]  Diagnosis: Cholecystitis with cholelithiasis [607371]  Admitting Physician: CCS, [place]  Attending Physician: CCS, MD [[id number]]  Estimated length of stay: past midnight tomorrow  Certification:: I certify this patient will need inpatient services for at least 2 midnights  Bed request comments: [address]  PT Class (Do Not Modify): Inpatient [101]  PT Acc Code (Do Not Modify): Private [1]       Medical History Past Medical History:  Diagnosis Date  . Asthma     Allergies No Known Allergies  IV Location/Drains/Wounds Patient Lines/Drains/Airways Status   Active Line/Drains/Airways    Name:   Placement date:   Placement time:   Site:   Days:   Peripheral IV 07/09/18 Right Antecubital   07/09/18    1107    Antecubital   less than 1          Labs/Imaging Results for orders placed or performed during the hospital encounter of 07/09/18 (from the past 48 hour(s))  CBC     Status: Abnormal   Collection Time: 07/09/18 11:08 AM  Result Value Ref Range   WBC 7.5 4.0 - 10.5 K/uL   RBC 3.77 (L) 3.87 - 5.11 MIL/uL   Hemoglobin 11.3 (L) 12.0 - 15.0 g/dL   HCT 36.7 36.0 - 46.0 %   MCV 97.3 80.0 - 100.0 fL   MCH 30.0 26.0 - 34.0 pg   MCHC 30.8 30.0 - 36.0 g/dL   RDW 13.2 11.5 - 15.5 %   Platelets 175 150 - 400 K/uL   nRBC 0.0 0.0 - 0.2 %    Comment: Performed at [facility], [address] [place], [place] [address]  Comprehensive metabolic panel     Status: Abnormal   Collection Time: 07/09/18 11:08 AM  Result Value Ref Range   Sodium 140 135 - 145 mmol/L   Potassium 3.7 3.5 - 5.1 mmol/L   Chloride 109 98 - 111 mmol/L   CO2 21  (L) 22 - 32 mmol/L   Glucose, Bld 133 (H) 70 - 99 mg/dL   BUN 12 6 - 20 mg/dL   Creatinine, Ser 0.56 0.44 - 1.00 mg/dL   Calcium 9.3 8.9 - 10.3 mg/dL   Total Protein 7.7 6.5 - 8.1 g/dL   Albumin 4.1 3.5 - 5.0 g/dL   AST 26 15 - 41 U/L   ALT 17 0 - 44 U/L   Alkaline Phosphatase 33 (L) 38 - 126 U/L   Total Bilirubin 0.3 0.3 - 1.2 mg/dL   GFR calc non Af Amer >60 >60 mL/min   GFR calc Af Amer >60 >60 mL/min    Comment: (NOTE) The eGFR has been calculated using the CKD EPI equation. This calculation has not been validated in all clinical situations. eGFR's persistently <60 mL/min signify possible Chronic Kidney Disease.    Anion gap 10 5 - 15    Comment: Performed at [facility], [address] [place], [place] [address]  Lipase, blood     Status: None   Collection Time: 07/09/18 11:08 AM  Result Value Ref Range   Lipase 24 11 - 51 U/L    Comment: Performed  at [facility], [address] [place], [place] [address]  Urinalysis, Routine w reflex microscopic     Status: Abnormal   Collection Time: 07/09/18  2:32 PM  Result Value Ref Range   Color, Urine YELLOW YELLOW   APPearance HAZY (A) CLEAR   Specific Gravity, Urine >1.030 (H) 1.005 - 1.030   pH 5.5 5.0 - 8.0   Glucose, UA NEGATIVE NEGATIVE mg/dL   Hgb urine dipstick TRACE (A) NEGATIVE   Bilirubin Urine NEGATIVE NEGATIVE   Ketones, ur NEGATIVE NEGATIVE mg/dL   Protein, ur 30 (A) NEGATIVE mg/dL   Nitrite NEGATIVE NEGATIVE   Leukocytes, UA TRACE (A) NEGATIVE    Comment: Performed at [facility], [address] [place], [place] [address]  Pregnancy, urine     Status: None   Collection Time: 07/09/18  2:32 PM  Result Value Ref Range   Preg Test, Ur NEGATIVE NEGATIVE    Comment:        THE SENSITIVITY OF THIS METHODOLOGY IS >20 mIU/mL. Performed at [facility], [address] [place], [place] [address]   Urinalysis, Microscopic (reflex)     Status: Abnormal   Collection  Time: 07/09/18  2:32 PM  Result Value Ref Range   RBC / HPF 0-5 0 - 5 RBC/hpf   WBC, UA 6-10 0 - 5 WBC/hpf   Bacteria, UA MANY (A) NONE SEEN   Squamous Epithelial / LPF 11-20 0 - 5   Trichomonas, UA PRESENT (A) NONE SEEN    Comment: Performed at [facility], [address] [place], [place] [address]   US Abdomen Limited Ruq  Result Date: 07/09/2018 CLINICAL DATA:  32 year old presenting with epigastric abdominal pain, nausea and vomiting for the past 2 days that began after a large meal. EXAM: ULTRASOUND ABDOMEN LIMITED RIGHT UPPER QUADRANT COMPARISON:  None. FINDINGS: Gallbladder: Multiple shadowing gallstones within the gallbladder, the largest on the order of 2 cm in size. Borderline gallbladder wall thickening at 4 mm. Positive sonographic Murphy sign during the examination according to the ultrasound technologist. Common bile duct: Diameter: Approximately 3 mm. Liver: Normal size and echotexture without focal parenchymal abnormality. Portal vein is patent on color Doppler imaging with normal direction of blood flow towards the liver. IMPRESSION: 1. Cholelithiasis. Borderline gallbladder wall thickening and positive sonographic Murphy sign likely indicates acute cholecystitis. 2. Otherwise normal examination. Electronically Signed   By: Evangeline Dakin M.D.   On: 07/09/2018 12:32   None  Pending Labs Unresulted Labs (From admission, onward)    Start     Ordered   Signed and Held  HIV antibody (Routine Testing)  Once,   R     Signed and Held          Vitals/Pain Today's Vitals   07/09/18 1227 07/09/18 1432 07/09/18 1435 07/09/18 1642  BP:   115/73   Pulse:  76 81   Resp:  18    Temp:  98.8 F (37.1 C) 98.1 F (36.7 C)   TempSrc:   Oral   SpO2:  100% 100%   Weight:      Height:      PainSc: 2   0-No pain 2     Isolation Precautions No active isolations  Medications Medications  0.9 %  sodium chloride infusion ( Intravenous Stopped 07/09/18 1431)   ondansetron (ZOFRAN) injection 4 mg (4 mg Intravenous Given 07/09/18 1114)  sodium chloride 0.9 % bolus 1,000  mL ( Intravenous Stopped 07/09/18 1218)  HYDROmorphone (DILAUDID) injection 1 mg (1 mg Intravenous Given 07/09/18 1114)  cefTRIAXone (ROCEPHIN) 2 g in sodium chloride 0.9 % 100 mL IVPB ( Intravenous Stopped 07/09/18 1421)    Mobility walks

## 2018-07-09 NOTE — ED Notes (Signed)
Dr. Ezzard StandingNewman has just seen her. She remains comfortable.

## 2018-07-09 NOTE — H&P (Signed)
Re:   Sarah Drake DOB:   25-May-1986 MRN:   [id number]  Chief Complaint Abdominal pain  ASSESEMENT AND PLAN: 1.  Gall bladder disease  She is still uncomfortable and wants to stay to have her gall bladder removed.  I discussed with the patient the indications and risks of gall bladder surgery.  The primary risks of gall bladder surgery include, but are not limited to, bleeding, infection, common bile duct injury, and open surgery.  There is also the risk that the patient may have continued symptoms after surgery.  We discussed the typical post-operative recovery course. I tried to answer the patient's questions.  I gave the patient literature about gall bladder surgery.  2.  Asthma  Chief Complaint  Patient presents with  . Abdominal Pain   PHYSICIAN REQUESTING CONSULTATION:  Dr. Joaquim LaiK. Stienl, [facility]  HISTORY OF PRESENT ILLNESS: Sarah Drake is a 32 y.o. (DOB: 25-May-1986)  AA  female whose primary care physician is System, Pcp Not In and [facility]. Her father, Clayborn HeronBruce Taurnace, is with her.   She has had some vague indigestion in the past.  But she has never had any upper endoscopy or prior history of gallbladder disease.  She has a great aunt who recently had gallbladder surgery.  She ate at [employer] a [employer], yesterday where she felt like she ate too much.  Around 2 PM she developed increasing abdominal pain.  She says she vomited as many as 12 times.  She tried to go to work last night at 5 PM, but could not finish her shift.  [facility].  She denies any history of stomach, liver, or colon disease.  Her only prior abdominal surgery has been 3 C-sections.  WBC - 7,500 - 07/09/2018 US of abdomen - 07/09/2018 - 1. Cholelithiasis. Borderline gallbladder wall thickening and positive sonographic Murphy sign likely indicates acute cholecystitis. 2. Otherwise normal examination.   Past  Medical History:  Diagnosis Date  . Asthma       Past Surgical History:  Procedure Laterality Date  . CESAREAN SECTION    . TUBAL LIGATION        Current Facility-Administered Medications  Medication Dose Route Frequency Provider Last Rate Last Dose  . 0.9 %  sodium chloride infusion   Intravenous PRN Azalia Bilisampos, Kevin, MD   Stopped at 07/09/18 1431   Current Outpatient Medications  Medication Sig Dispense Refill  . albuterol (PROAIR HFA) 108 (90 Base) MCG/ACT inhaler Inhale into the lungs every 6 (six) hours as needed for wheezing or shortness of breath.    Marland Kitchen. albuterol (PROAIR HFA) 108 (90 Base) MCG/ACT inhaler Inhale 2 puffs into the lungs every 4 (four) hours as needed for wheezing or shortness of breath. 1 Inhaler 3  . diphenhydrAMINE (BENADRYL) 50 MG capsule Take 50 mg by mouth every 6 (six) hours as needed.    . fluticasone (FLONASE) 50 MCG/ACT nasal spray Two sprays each nostril once a day for nasal congestion or drainage. 16 g 5  . fluticasone (FLOVENT HFA) 110 MCG/ACT inhaler Two puffs once a day with spacer to prevent cough or wheeze.  Rinse, gargle and spit after use. 12 g 5  . montelukast (SINGULAIR) 10 MG tablet One tablet at bedtime to prevent shortness of breath, cough and wheeze. 30 tablet 5  . Olopatadine HCl 0.2 % SOLN One drop each eye once a day as needed for  red itchy eyes. 2.5 mL 5     No Known Allergies  REVIEW OF SYSTEMS: Skin:  No history of rash.  No history of abnormal moles. Infection:  No history of hepatitis or HIV.  No history of MRSA. Neurologic:  No history of stroke.  No history of seizure.  No history of headaches. Cardiac:  No history of hypertension. No history of heart disease.   Pulmonary:  Asthma  Endocrine:  No diabetes. No thyroid disease. Gastrointestinal:  See HPI Urologic:  No history of kidney stones.  No history of bladder infections. Musculoskeletal:  No history of joint or back disease. Hematologic:  No bleeding disorder.  No  history of anemia.  Not anticoagulated. Psycho-social:  The patient is oriented.   The patient has no obvious psychologic or social impairment to understanding our conversation and plan.  SOCIAL and FAMILY HISTORY: Unmarried. Has 3 boys: ages 72, 21, and 36. She works in the days at a [employer].  And at night, she works in the Personnel officer at [employer]. Her father, Clayborn Heron, is with her.  Family history of gall bladder disease in her great aunt (mother's aunt).  PHYSICAL EXAM: BP 115/73   Pulse 81   Temp 98.1 F (36.7 C) (Oral)   Resp 18   Ht 5\' 5"  (1.651 m)   Wt 93 kg   LMP 05/24/2018   SpO2 100%   BMI 34.11 kg/m   General: Obese AA F who is alert and generally healthy appearing.  Skin:  Inspection and palpation - no mass or rash. Eyes:  Conjunctiva and lids unremarkable.            Pupils are equal Ears, Nose, Mouth, and Throat:  Ears and nose unremarkable            Lips and teeth are unremarable. Neck: Supple. No mass, trachea midline.  No thyroid mass. Lymph Nodes:  No supraclavicular, cervical, or inguinal nodes. Lungs: Normal respiratory effort.  Clear to auscultation and symmetric breath sounds. Heart:  Palpation of the heart is normal.            Auscultation: RRR. No murmur or rub.  Abdomen: Soft. No mass. No hernia.             Normal bowel sounds.  She is sore in her RUQ, but no peritoneal sxes.  Rectal: Not done. Musculoskeletal:  Good muscle strength and ROM  in upper and lower extremities.  Neurologic:  Grossly intact to motor and sensory function. Psychiatric: Normal judgement and insight. Behavior is normal.            Oriented to time, person, place.   DATA REVIEWED, COUNSELING AND COORDINATION OF CARE: Epic notes reviewed. Counseling and coordination of care exceeded more than 50% of the time spent with patient. Total time spent with patient and charting: 50 minutes  Ovidio Kin, MD,  [facility], [place] [address]  [address]   [place], [place]    [address] Phone:  [phone] FAX:  [phone]

## 2018-07-09 NOTE — ED Notes (Signed)
I have just paged Dr. Ezzard StandingNewman.

## 2018-07-09 NOTE — ED Notes (Signed)
Dr. Ezzard StandingNewman is in our dep't. And I make him aware of her presence in room 15. He tells me he will see her soon.

## 2018-07-09 NOTE — Anesthesia Preprocedure Evaluation (Addendum)
Anesthesia Evaluation  Patient identified by MRN, date of birth, ID band Patient awake    Reviewed: Allergy & Precautions, NPO status , Patient's Chart, lab work & pertinent test results  History of Anesthesia Complications Negative for: history of anesthetic complications  Airway Mallampati: II  TM Distance: >3 FB Neck ROM: Full    Dental no notable dental hx. (+) Teeth Intact, Dental Advisory Given   Pulmonary asthma ,    Pulmonary exam normal breath sounds clear to auscultation       Cardiovascular negative cardio ROS Normal cardiovascular exam Rhythm:Regular Rate:Normal     Neuro/Psych negative neurological ROS     GI/Hepatic Neg liver ROS, cholecystitis   Endo/Other  negative endocrine ROS  Renal/GU negative Renal ROS     Musculoskeletal negative musculoskeletal ROS (+)   Abdominal   Peds  Hematology negative hematology ROS (+)   Anesthesia Other Findings Day of surgery medications reviewed with the patient.  Reproductive/Obstetrics                            Anesthesia Physical Anesthesia Plan  ASA: II  Anesthesia Plan: General   Post-op Pain Management:    Induction: Intravenous  PONV Risk Score and Plan: 3 and Ondansetron, Dexamethasone, Treatment may vary due to age or medical condition, Scopolamine patch - Pre-op and Midazolam  Airway Management Planned: Oral ETT  Additional Equipment:   Intra-op Plan:   Post-operative Plan: Extubation in OR  Informed Consent: I have reviewed the patients History and Physical, chart, labs and discussed the procedure including the risks, benefits and alternatives for the proposed anesthesia with the patient or authorized representative who has indicated his/her understanding and acceptance.   Dental advisory given  Plan Discussed with: CRNA  Anesthesia Plan Comments:        Anesthesia Quick Evaluation

## 2018-07-10 ENCOUNTER — Inpatient Hospital Stay (HOSPITAL_COMMUNITY): Payer: BLUE CROSS/BLUE SHIELD | Admitting: Certified Registered Nurse Anesthetist

## 2018-07-10 ENCOUNTER — Inpatient Hospital Stay (HOSPITAL_COMMUNITY): Payer: BLUE CROSS/BLUE SHIELD

## 2018-07-10 ENCOUNTER — Encounter (HOSPITAL_COMMUNITY): Admission: EM | Disposition: A | Discharge status: Home / Self Care | Payer: Self-pay | Source: Home / Self Care

## 2018-07-10 DIAGNOSIS — K8000 Calculus of gallbladder with acute cholecystitis without obstruction: Secondary | ICD-10-CM

## 2018-07-10 DIAGNOSIS — R932 Abnormal findings on diagnostic imaging of liver and biliary tract: Secondary | ICD-10-CM

## 2018-07-10 HISTORY — PX: CHOLECYSTECTOMY: SHX55

## 2018-07-10 LAB — MRSA PCR SCREENING: MRSA by PCR: NEGATIVE

## 2018-07-10 LAB — HIV ANTIBODY (ROUTINE TESTING W REFLEX): HIV Screen 4th Generation wRfx: NONREACTIVE

## 2018-07-10 SURGERY — LAPAROSCOPIC CHOLECYSTECTOMY WITH INTRAOPERATIVE CHOLANGIOGRAM
Anesthesia: General | Site: Abdomen

## 2018-07-10 MED ORDER — PROMETHAZINE HCL 25 MG/ML IJ SOLN: 6.2500 mg | INTRAMUSCULAR | Status: DC | PRN

## 2018-07-10 MED ORDER — HYDROMORPHONE HCL 1 MG/ML IJ SOLN
INTRAMUSCULAR | Status: DC | PRN
  Administered 2018-07-10: 0.5 mg via INTRAVENOUS

## 2018-07-10 MED ORDER — KCL IN DEXTROSE-NACL 20-5-0.45 MEQ/L-%-% IV SOLN
INTRAVENOUS | Status: DC
  Administered 2018-07-10 – 2018-07-13 (×5): via INTRAVENOUS
  Filled 2018-07-10 (×6): qty 1000

## 2018-07-10 MED ORDER — FENTANYL CITRATE (PF) 100 MCG/2ML IJ SOLN
INTRAMUSCULAR | Status: DC | PRN
  Administered 2018-07-10 (×2): 50 ug via INTRAVENOUS
  Administered 2018-07-10: 100 ug via INTRAVENOUS
  Administered 2018-07-10: 50 ug via INTRAVENOUS

## 2018-07-10 MED ORDER — 0.9 % SODIUM CHLORIDE (POUR BTL) OPTIME
TOPICAL | Status: DC | PRN
  Administered 2018-07-10: 1000 mL

## 2018-07-10 MED ORDER — PHENYLEPHRINE 40 MCG/ML (10ML) SYRINGE FOR IV PUSH (FOR BLOOD PRESSURE SUPPORT)
PREFILLED_SYRINGE | INTRAVENOUS | Status: DC | PRN
  Administered 2018-07-10: 80 ug via INTRAVENOUS

## 2018-07-10 MED ORDER — BUPIVACAINE-EPINEPHRINE (PF) 0.25% -1:200000 IJ SOLN
INTRAMUSCULAR | Status: AC
  Filled 2018-07-10: qty 30

## 2018-07-10 MED ORDER — LACTATED RINGERS IV SOLN
INTRAVENOUS | Status: DC
  Administered 2018-07-10 (×2): via INTRAVENOUS

## 2018-07-10 MED ORDER — ONDANSETRON HCL 4 MG/2ML IJ SOLN
INTRAMUSCULAR | Status: DC | PRN
  Administered 2018-07-10: 4 mg via INTRAVENOUS

## 2018-07-10 MED ORDER — IOPAMIDOL (ISOVUE-300) INJECTION 61%
INTRAVENOUS | Status: AC
  Filled 2018-07-10: qty 50

## 2018-07-10 MED ORDER — LACTATED RINGERS IR SOLN
Status: DC | PRN
  Administered 2018-07-10: 1000 mL

## 2018-07-10 MED ORDER — FENTANYL CITRATE (PF) 250 MCG/5ML IJ SOLN
INTRAMUSCULAR | Status: AC
  Filled 2018-07-10: qty 5

## 2018-07-10 MED ORDER — SUGAMMADEX SODIUM 200 MG/2ML IV SOLN
INTRAVENOUS | Status: DC | PRN
  Administered 2018-07-10: 200 mg via INTRAVENOUS

## 2018-07-10 MED ORDER — ROCURONIUM BROMIDE 10 MG/ML (PF) SYRINGE
PREFILLED_SYRINGE | INTRAVENOUS | Status: DC | PRN
  Administered 2018-07-10: 60 mg via INTRAVENOUS
  Administered 2018-07-10: 10 mg via INTRAVENOUS
  Administered 2018-07-10: 5 mg via INTRAVENOUS

## 2018-07-10 MED ORDER — SCOPOLAMINE 1 MG/3DAYS TD PT72
MEDICATED_PATCH | TRANSDERMAL | Status: DC | PRN
  Administered 2018-07-10: 1 via TRANSDERMAL

## 2018-07-10 MED ORDER — DEXAMETHASONE SODIUM PHOSPHATE 10 MG/ML IJ SOLN
INTRAMUSCULAR | Status: DC | PRN
  Administered 2018-07-10: 10 mg via INTRAVENOUS

## 2018-07-10 MED ORDER — PROPOFOL 10 MG/ML IV BOLUS
INTRAVENOUS | Status: DC | PRN
  Administered 2018-07-10: 180 mg via INTRAVENOUS

## 2018-07-10 MED ORDER — FENTANYL CITRATE (PF) 100 MCG/2ML IJ SOLN
25.0000 ug | INTRAMUSCULAR | Status: DC | PRN
  Administered 2018-07-10: 25 ug via INTRAVENOUS

## 2018-07-10 MED ORDER — MORPHINE SULFATE (PF) 4 MG/ML IV SOLN
1.0000 mg | INTRAVENOUS | Status: DC | PRN
  Administered 2018-07-10: 2 mg via INTRAVENOUS
  Administered 2018-07-10: 1 mg via INTRAVENOUS
  Filled 2018-07-10 (×2): qty 1

## 2018-07-10 MED ORDER — BUPIVACAINE-EPINEPHRINE 0.25% -1:200000 IJ SOLN
INTRAMUSCULAR | Status: DC | PRN
  Administered 2018-07-10: 30 mL

## 2018-07-10 MED ORDER — LIDOCAINE 2% (20 MG/ML) 5 ML SYRINGE
INTRAMUSCULAR | Status: DC | PRN
  Administered 2018-07-10: 40 mg via INTRAVENOUS
  Administered 2018-07-10: 60 mg via INTRAVENOUS

## 2018-07-10 MED ORDER — PROPOFOL 10 MG/ML IV BOLUS
INTRAVENOUS | Status: AC
  Filled 2018-07-10: qty 20

## 2018-07-10 MED ORDER — ACETAMINOPHEN 10 MG/ML IV SOLN: 1000.0000 mg | Freq: Once | INTRAVENOUS | Status: DC | PRN

## 2018-07-10 MED ORDER — MIDAZOLAM HCL 2 MG/2ML IJ SOLN
INTRAMUSCULAR | Status: AC
  Filled 2018-07-10: qty 2

## 2018-07-10 MED ORDER — OXYCODONE HCL 5 MG/5ML PO SOLN: 5.0000 mg | Freq: Once | ORAL | Status: DC | PRN

## 2018-07-10 MED ORDER — HYDROCODONE-ACETAMINOPHEN 5-325 MG PO TABS
1.0000 | ORAL_TABLET | ORAL | Status: DC | PRN
  Administered 2018-07-10: 2 via ORAL
  Administered 2018-07-10: 1 via ORAL
  Administered 2018-07-11 (×2): 2 via ORAL
  Filled 2018-07-10: qty 2
  Filled 2018-07-10: qty 1
  Filled 2018-07-10 (×3): qty 2

## 2018-07-10 MED ORDER — HYDROMORPHONE HCL 2 MG/ML IJ SOLN
INTRAMUSCULAR | Status: AC
  Filled 2018-07-10: qty 1

## 2018-07-10 MED ORDER — SODIUM CHLORIDE 0.9 % IV SOLN
2.0000 g | INTRAVENOUS | Status: DC
  Administered 2018-07-10 – 2018-07-13 (×4): 2 g via INTRAVENOUS
  Filled 2018-07-10 (×4): qty 2

## 2018-07-10 MED ORDER — SODIUM CHLORIDE 0.9 % IV SOLN
INTRAVENOUS | Status: DC | PRN
  Administered 2018-07-10: 25 mL

## 2018-07-10 MED ORDER — OXYCODONE HCL 5 MG PO TABS: 5.0000 mg | ORAL_TABLET | Freq: Once | ORAL | Status: DC | PRN

## 2018-07-10 MED ORDER — SCOPOLAMINE 1 MG/3DAYS TD PT72
MEDICATED_PATCH | TRANSDERMAL | Status: AC
  Filled 2018-07-10: qty 1

## 2018-07-10 MED ORDER — FENTANYL CITRATE (PF) 100 MCG/2ML IJ SOLN
INTRAMUSCULAR | Status: AC
  Filled 2018-07-10: qty 2

## 2018-07-10 SURGICAL SUPPLY — 39 items
APPLIER CLIP 5 13 M/L LIGAMAX5 (MISCELLANEOUS)
APPLIER CLIP ROT 10 11.4 M/L (STAPLE) ×3
CABLE HIGH FREQUENCY MONO STRZ (ELECTRODE) ×3 IMPLANT
CATH EMB 4FR 40CM (CATHETERS) ×3 IMPLANT
CHLORAPREP W/TINT 26ML (MISCELLANEOUS) ×3 IMPLANT
CHOLANGIOGRAM CATH TAUT (CATHETERS) ×3 IMPLANT
CLIP APPLIE 5 13 M/L LIGAMAX5 (MISCELLANEOUS) IMPLANT
CLIP APPLIE ROT 10 11.4 M/L (STAPLE) ×1 IMPLANT
CLOSURE WOUND 1/4X4 (GAUZE/BANDAGES/DRESSINGS)
COVER MAYO STAND STRL (DRAPES) ×3 IMPLANT
COVER SURGICAL LIGHT HANDLE (MISCELLANEOUS) ×3 IMPLANT
COVER WAND RF STERILE (DRAPES) IMPLANT
DECANTER SPIKE VIAL GLASS SM (MISCELLANEOUS) ×3 IMPLANT
DERMABOND ADVANCED (GAUZE/BANDAGES/DRESSINGS) ×2
DERMABOND ADVANCED .7 DNX12 (GAUZE/BANDAGES/DRESSINGS) ×1 IMPLANT
DRAPE C-ARM 42X120 X-RAY (DRAPES) ×3 IMPLANT
ELECT REM PT RETURN 15FT ADLT (MISCELLANEOUS) ×3 IMPLANT
GLOVE SURG SIGNA 7.5 PF LTX (GLOVE) ×3 IMPLANT
GOWN STRL REUS W/TWL XL LVL3 (GOWN DISPOSABLE) ×9 IMPLANT
HEMOSTAT SURGICEL 4X8 (HEMOSTASIS) IMPLANT
IV CATH 14GX2 1/4 (CATHETERS) ×3 IMPLANT
IV SET EXTENSION CATH 6 NF (IV SETS) ×3 IMPLANT
KIT BASIN OR (CUSTOM PROCEDURE TRAY) ×3 IMPLANT
POUCH RETRIEVAL ECOSAC 10 (ENDOMECHANICALS) ×1 IMPLANT
POUCH RETRIEVAL ECOSAC 10MM (ENDOMECHANICALS) ×2
SCISSORS LAP 5X35 DISP (ENDOMECHANICALS) ×3 IMPLANT
SET IRRIG TUBING LAPAROSCOPIC (IRRIGATION / IRRIGATOR) ×3 IMPLANT
SLEEVE ADV FIXATION 5X100MM (TROCAR) ×3 IMPLANT
STOPCOCK 4 WAY LG BORE MALE ST (IV SETS) ×3 IMPLANT
STRIP CLOSURE SKIN 1/4X4 (GAUZE/BANDAGES/DRESSINGS) IMPLANT
SUT MNCRL AB 4-0 PS2 18 (SUTURE) ×3 IMPLANT
SYR 10ML ECCENTRIC (SYRINGE) ×3 IMPLANT
TOWEL OR 17X26 10 PK STRL BLUE (TOWEL DISPOSABLE) ×3 IMPLANT
TOWEL OR NON WOVEN STRL DISP B (DISPOSABLE) ×3 IMPLANT
TRAY LAPAROSCOPIC (CUSTOM PROCEDURE TRAY) ×3 IMPLANT
TROCAR ADV FIXATION 11X100MM (TROCAR) ×3 IMPLANT
TROCAR ADV FIXATION 5X100MM (TROCAR) ×3 IMPLANT
TROCAR XCEL BLUNT TIP 100MML (ENDOMECHANICALS) ×3 IMPLANT
TUBING INSUF HEATED (TUBING) ×3 IMPLANT

## 2018-07-10 NOTE — Progress Notes (Signed)
[facility]:  [phone] General Surgery Progress Note   LOS: 1 day  POD -  Day of Surgery  Chief Complaint: Abdominal pain  Assessment and Plan: 1  Cholecystitis, cholelithiasis  For cholecystectomy today.  Her parents are on the way.  No further questions.  She's still having pain  2.  Asthma  Active Problems:   Cholecystitis with cholelithiasis  Subjective:  Still hurting.  Parents on the way.  Objective:   Vitals:   07/10/18 0148 07/10/18 0553  BP: 121/87 120/79  Pulse: 80 70  Resp: 16 16  Temp: 98.1 F (36.7 C) 98.1 F (36.7 C)  SpO2: 100% 100%     Intake/Output from previous day:  11/23 0701 - 11/24 0700 In: 2199.3 [I.V.:1106.8; IV Piggyback:1092.4] Out: -   Intake/Output this shift:  No intake/output data recorded.   Physical Exam:   General: WN AA F who is alert and oriented.    HEENT: Normal. Pupils equal.   Abdomen: Still tender in RUQ   Lab Results:    Recent Labs    07/09/18 1108  WBC 7.5  HGB 11.3*  HCT 36.7  PLT 175    BMET   Recent Labs    07/09/18 1108  NA 140  K 3.7  CL 109  CO2 21*  GLUCOSE 133*  BUN 12  CREATININE 0.56  CALCIUM 9.3    PT/INR  No results for input(s): LABPROT, INR in the last 72 hours.  ABG  No results for input(s): PHART, HCO3 in the last 72 hours.  Invalid input(s): PCO2, PO2   Studies/Results:  [place] Abdomen Limited Ruq  Result Date: 07/09/2018 CLINICAL DATA:  32 year old presenting with epigastric abdominal pain, nausea and vomiting for the past 2 days that began after a large meal. EXAM: ULTRASOUND ABDOMEN LIMITED RIGHT UPPER QUADRANT COMPARISON:  None. FINDINGS: Gallbladder: Multiple shadowing gallstones within the gallbladder, the largest on the order of 2 cm in size. Borderline gallbladder wall thickening at 4 mm. Positive sonographic Murphy sign during the examination according to the ultrasound technologist. Common bile duct: Diameter: Approximately 3 mm. Liver: Normal  size and echotexture without focal parenchymal abnormality. Portal vein is patent on color Doppler imaging with normal direction of blood flow towards the liver. IMPRESSION: 1. Cholelithiasis. Borderline gallbladder wall thickening and positive sonographic Murphy sign likely indicates acute cholecystitis. 2. Otherwise normal examination. Electronically Signed   By: Hulan Saashomas  Lawrence M.D.   On: 07/09/2018 12:32     Anti-infectives:   Anti-infectives (From admission, onward)   Start     Dose/Rate Route Frequency Ordered Stop   07/10/18 1000  cefTRIAXone (ROCEPHIN) 2 g in sodium chloride 0.9 % 100 mL IVPB     2 g 200 mL/hr over 30 Minutes Intravenous Every 24 hours 07/09/18 1939     07/09/18 1330  cefTRIAXone (ROCEPHIN) 2 g in sodium chloride 0.9 % 100 mL IVPB     2 g 200 mL/hr over 30 Minutes Intravenous  Once 07/09/18 1315 07/09/18 1421      Ovidio Kinavid Taneika Choi, MD, FACS Pager: [phone] [facility]: [phone] 07/10/2018

## 2018-07-10 NOTE — Anesthesia Procedure Notes (Signed)
Procedure Name: Intubation Date/Time: 07/10/2018 7:55 AM Performed by: Anne Fu, CRNA Pre-anesthesia Checklist: Patient identified, Emergency Drugs available, Suction available, Patient being monitored and Timeout performed Patient Re-evaluated:Patient Re-evaluated prior to induction Oxygen Delivery Method: Circle system utilized Preoxygenation: Pre-oxygenation with 100% oxygen Induction Type: IV induction Ventilation: Mask ventilation without difficulty Laryngoscope Size: Mac and 4 Grade View: Grade I Tube type: Oral Tube size: 7.5 mm Number of attempts: 1 Airway Equipment and Method: Stylet Placement Confirmation: ETT inserted through vocal cords under direct vision,  positive ETCO2 and breath sounds checked- equal and bilateral Secured at: 21 cm Tube secured with: Tape Dental Injury: Teeth and Oropharynx as per pre-operative assessment

## 2018-07-10 NOTE — H&P (View-Only) (Signed)
[facility]  Primary Care Physician:  System, Pcp Not In Primary Gastroenterologist: Unassigned     Reason for Consultation:   choledocholithiasis    ASSESSMENT / PLAN:    1. 32 yo female with acute cholecystitis / cholelithiasis, s/p lap cholecystectomy this am. IOC positive for distal CBD obstruction, most likely stone. Pre-op liver tests normal.  -she has just arrived to floor from PACU and is very sleepy.  -She will need ERCP with stone extraction. Apparently Dr. Ezzard StandingNewman discussed this with her but she is too sleepy to remember. I will scheduled procedure to be done tomorrow but we will need to discuss procedure with her in am when fully alert.  2. Hx of asthma   3. Obesity    Attending Physician Note   I have taken a history, examined the patient and reviewed the chart. I agree with the Advanced Practitioner's note, impression and recommendations.   S/P lap chole today for acute cholecystitis/cholelithiasis with IOC showing a distal CBD cut off/obstruction c/w a stone. I reviewed the IOC images. Pre-op LFT were normal and on pre-op RUQ US CBD measured 3 mm. Recommend ERCP tomorrow with Dr. Meridee ScoreMansouraty. The risks (including pancreatitis, bleeding, perforation, infection, missed lesions, medication reactions and possible hospitalization or surgery if complications occur), benefits, and alternatives to ERCP with possible sphinctertomy, stone extraction, stent were discussed with the patient and they consent to proceed.    Sarah HeadMalcolm Riah Kehoe, MD [facility] [phone]     HPI: Sarah Bertinamika Lauder is a 32 y.o. female who [facility] / vomiting and abdominal pain.   U/S remarkable for cholelithiasis, borderline gb wall thickening and + Murphy's sign. [facility] by [facility]. Liver tests normal. WBC normal. Hgb 11.3 with normal MCV.  Patient underwent lap cholecystectomy with IOC this am. Gallbladder was edematous and thick.  IOC did not show contrast entering the duodenum c/w distal obstruction. Surgery attempted to dislodge the stone with a fogarty passed down cystic duct into CBD but catheter wouldn't pass the stone.   Patient has just returned from PACU. She is sleepy, doesn't remember [facility] talking to her about CBD stone.      Past Medical History:  Diagnosis Date  . Asthma     Past Surgical History:  Procedure Laterality Date  . CESAREAN SECTION    . TUBAL LIGATION      Prior to Admission medications   Medication Sig Start Date End Date Taking? Authorizing Provider  albuterol (PROAIR HFA) 108 (90 Base) MCG/ACT inhaler Inhale 2 puffs into the lungs every 4 (four) hours as needed for wheezing or shortness of breath. 11/01/17  Yes Ambs, Norvel RichardsAnne M, FNP  diphenhydrAMINE (BENADRYL) 50 MG capsule Take 50 mg by mouth every 6 (six) hours as needed for sleep.    Yes [provider]  Multiple Vitamin (MULTIVITAMIN WITH MINERALS) TABS tablet Take 1 tablet by mouth daily.   Yes [provider]  fluticasone (FLONASE) 50 MCG/ACT nasal spray Two sprays each nostril once a day for nasal congestion or drainage. Patient not taking: Reported on 07/09/2018 11/01/17   Hetty BlendAmbs, Anne M, FNP  fluticasone (FLOVENT HFA) 110 MCG/ACT inhaler Two puffs once a day with spacer to prevent cough or wheeze.  Rinse, gargle and spit after use. Patient not taking: Reported on 07/09/2018 11/01/17   Hetty BlendAmbs, Anne M, FNP  montelukast (SINGULAIR) 10 MG tablet One tablet at bedtime to prevent shortness of breath, cough and wheeze. Patient not  taking: Reported on 07/09/2018 11/01/17   Hetty Blend, FNP  Olopatadine HCl 0.2 % SOLN One drop each eye once a day as needed for red itchy eyes. Patient not taking: Reported on 07/09/2018 11/01/17   Hetty Blend, FNP    Current Facility-Administered Medications  Medication Dose Route Frequency Provider Last Rate Last Dose  . fentaNYL (SUBLIMAZE) 100 MCG/2ML injection           . acetaminophen  (OFIRMEV) IV 1,000 mg  1,000 mg Intravenous Once PRN Kaylyn Layer, MD      . albuterol (PROVENTIL) (2.5 MG/3ML) 0.083% nebulizer solution 3 mL  3 mL Inhalation Q4H PRN Ovidio Kin, MD      . Mitzi Hansen Hold] alum & mag hydroxide-simeth (MAALOX/MYLANTA) 200-200-20 MG/5ML suspension 30 mL  30 mL Oral Q6H PRN Ovidio Kin, MD   30 mL at 07/09/18 2156  . [MAR Hold] budesonide (PULMICORT) nebulizer solution 0.25 mg  0.25 mg Nebulization Daily PRN Ovidio Kin, MD      . cefTRIAXone (ROCEPHIN) 2 g in sodium chloride 0.9 % 100 mL IVPB  2 g Intravenous Q24H Ovidio Kin, MD      . dextrose 5 % and 0.45 % NaCl with KCl 20 mEq/L infusion   Intravenous Continuous Ovidio Kin, MD   Stopped at 07/10/18 [phone]  . diphenhydrAMINE (BENADRYL) capsule 50 mg  50 mg Oral Q6H PRN Ovidio Kin, MD      . fentaNYL (SUBLIMAZE) injection 25-50 mcg  25-50 mcg Intravenous Q5 min PRN Kaylyn Layer, MD   25 mcg at 07/10/18 1032  . lactated ringers infusion   Intravenous Continuous Kaylyn Layer, MD 125 mL/hr at 07/10/18 [phone]    . montelukast (SINGULAIR) tablet 10 mg  10 mg Oral QHS Ovidio Kin, MD   10 mg at 07/09/18 2156  . morphine 2 MG/ML injection 1-3 mg  1-3 mg Intravenous Q3H PRN Ovidio Kin, MD   2 mg at 07/10/18 0245  . ondansetron (ZOFRAN-ODT) disintegrating tablet 4 mg  4 mg Oral Q6H PRN Ovidio Kin, MD       Or  . ondansetron [facility]) injection 4 mg  4 mg Intravenous Q6H PRN Ovidio Kin, MD   4 mg at 07/10/18 0241  . oxyCODONE (Oxy IR/ROXICODONE) immediate release tablet 5 mg  5 mg Oral Once PRN Kaylyn Layer, MD       Or  . oxyCODONE (ROXICODONE) 5 MG/5ML solution 5 mg  5 mg Oral Once PRN Kaylyn Layer, MD      . oxyCODONE (Oxy IR/ROXICODONE) immediate release tablet 5-10 mg  5-10 mg Oral Q4H PRN Ovidio Kin, MD      . promethazine (PHENERGAN) injection 6.25-12.5 mg  6.25-12.5 mg Intravenous Q15 min PRN Kaylyn Layer, MD      . traMADol Janean Sark) tablet 50 mg  50 mg Oral Q6H PRN Ovidio Kin, MD   50 mg at 07/10/18 5409    Allergies as of 07/09/2018  . (No Known Allergies)    History reviewed. No pertinent family history.  Social History   Socioeconomic History  . Marital status: Single    Spouse name: Not on file  . Number of children: Not on file  . Years of education: Not on file  . Highest education level: Not on file  Occupational History  . Not on file  Social Needs  . Financial resource strain: Not on file  . Food insecurity:    Worry: Not on file  Inability: Not on file  . Transportation needs:    Medical: Not on file    Non-medical: Not on file  Tobacco Use  . Smoking status: Never Smoker  . Smokeless tobacco: Never Used  Substance and Sexual Activity  . Alcohol use: No  . Drug use: Yes    Types: Marijuana    Comment: daily  . Sexual activity: Not on file  Lifestyle  . Physical activity:    Days per week: Not on file    Minutes per session: Not on file  . Stress: Not on file  Relationships  . Social connections:    Talks on phone: Not on file    Gets together: Not on file    Attends religious service: Not on file    Active member of club or organization: Not on file    Attends meetings of clubs or organizations: Not on file    Relationship status: Not on file  . Intimate partner violence:    Fear of current or ex partner: Not on file    Emotionally abused: Not on file    Physically abused: Not on file    Forced sexual activity: Not on file  Other Topics Concern  . Not on file  Social History Narrative  . Not on file    Review of Systems: Not obtained. Patient just returned from PACU. She is not fully awake  Physical Exam: Vital signs in last 24 hours: Temp:  [98.1 F (36.7 C)-99.4 F (37.4 C)] 98.5 F (36.9 C) (11/24 1000) Pulse Rate:  [65-101] 91 (11/24 1038) Resp:  [11-20] 15 (11/24 1038) BP: (108-133)/(50-87) 128/80 (11/24 1030) SpO2:  [91 %-100 %] 91 % (11/24 1038) Last BM Date: 07/09/18 General:   Drowsy  female in NAD Psych:   cooperative. . Eyes:  Pupils equal, sclera clear, no icterus.   Conjunctiva pink. Ears:  Normal auditory acuity. Nose:  No deformity, discharge,  or lesions. Neck:  Supple; no masses Lungs:  Chest clear throughout to auscultation.  Marland Kitchen  Heart:  Regular rate and rhythm; no murmurs, no lower extremity edema Abdomen:  Post-op abdomen. Hypoactive bowel sounds    Rectal:  Deferred  Msk:  Symmetrical without gross deformities. . Neurologic:  Sleepy from surgery. Skin:  tattoos RUE and chest   Intake/Output from previous day: 11/23 0701 - 11/24 0700 In: 2199.3 [I.V.:1106.8; IV Piggyback:1092.4] Out: -  Intake/Output this shift: Total I/O In: 1300 [I.V.:1300] Out: 25 [Blood:25]  Lab Results: Recent Labs    07/09/18 1108  WBC 7.5  HGB 11.3*  HCT 36.7  PLT 175   BMET Recent Labs    07/09/18 1108  NA 140  K 3.7  CL 109  CO2 21*  GLUCOSE 133*  BUN 12  CREATININE 0.56  CALCIUM 9.3   LFT Recent Labs    07/09/18 1108  PROT 7.7  ALBUMIN 4.1  AST 26  ALT 17  ALKPHOS 33*  BILITOT 0.3     Studies/Results: US Abdomen Limited Ruq  Result Date: 07/09/2018 CLINICAL DATA:  32 year old presenting with epigastric abdominal pain, nausea and vomiting for the past 2 days that began after a large meal. EXAM: ULTRASOUND ABDOMEN LIMITED RIGHT UPPER QUADRANT COMPARISON:  None. FINDINGS: Gallbladder: Multiple shadowing gallstones within the gallbladder, the largest on the order of 2 cm in size. Borderline gallbladder wall thickening at 4 mm. Positive sonographic Murphy sign during the examination according to the ultrasound technologist. Common bile duct: Diameter: Approximately 3 mm. Liver: Normal size  and echotexture without focal parenchymal abnormality. Portal vein is patent on color Doppler imaging with normal direction of blood flow towards the liver. IMPRESSION: 1. Cholelithiasis. Borderline gallbladder wall thickening and positive sonographic Murphy sign  likely indicates acute cholecystitis. 2. Otherwise normal examination. Electronically Signed   By: Hulan Saas M.D.   On: 07/09/2018 12:32     Willette Cluster, NP-C @  07/10/2018, 10:42 AM

## 2018-07-10 NOTE — Anesthesia Postprocedure Evaluation (Signed)
Anesthesia Post Note  Patient: Belva Bertinamika Mikelson  Procedure(s) Performed: LAPAROSCOPIC CHOLECYSTECTOMY WITH INTRAOPERATIVE CHOLANGIOGRAM, 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 (N/A Abdomen)     Patient location during evaluation: PACU Anesthesia Type: General Level of consciousness: awake and alert Pain management: pain level controlled Vital Signs Assessment: post-procedure vital signs reviewed and stable Respiratory status: spontaneous breathing, nonlabored ventilation and respiratory function stable Cardiovascular status: blood pressure returned to baseline and stable Postop Assessment: no apparent nausea or vomiting Anesthetic complications: no    Last Vitals:  Vitals:   07/10/18 1045 07/10/18 1054  BP: 126/77 122/73  Pulse: 88 83  Resp: 13 14  Temp:  36.9 C  SpO2: 99% 98%    Last Pain:  Vitals:   07/10/18 1054  TempSrc:   PainSc: 0-No pain                 Kaylyn LayerKathryn E Rebecah Dangerfield

## 2018-07-10 NOTE — Consult Note (Addendum)
[facility]  Primary Care Physician:  System, Pcp Not In Primary Gastroenterologist: Unassigned     Reason for Consultation:   choledocholithiasis    ASSESSMENT / PLAN:    1. 32 yo female with acute cholecystitis / cholelithiasis, s/p lap cholecystectomy this am. IOC positive for distal CBD obstruction, most likely stone. Pre-op liver tests normal.  -she has just arrived to floor from PACU and is very sleepy.  -She will need ERCP with stone extraction. Apparently Dr. Ezzard StandingNewman discussed this with her but she is too sleepy to remember. I will scheduled procedure to be done tomorrow but we will need to discuss procedure with her in am when fully alert.  2. Hx of asthma   3. Obesity    Attending Physician Note   I have taken a history, examined the patient and reviewed the chart. I agree with the Advanced Practitioner's note, impression and recommendations.   S/P lap chole today for acute cholecystitis/cholelithiasis with IOC showing a distal CBD cut off/obstruction c/w a stone. I reviewed the IOC images. Pre-op LFT were normal and on pre-op RUQ US CBD measured 3 mm. Recommend ERCP tomorrow with Dr. Meridee ScoreMansouraty. The risks (including pancreatitis, bleeding, perforation, infection, missed lesions, medication reactions and possible hospitalization or surgery if complications occur), benefits, and alternatives to ERCP with possible sphinctertomy, stone extraction, stent were discussed with the patient and they consent to proceed.    Claudette HeadMalcolm Reily Treloar, MD [facility] [phone]     HPI: Sarah Drake is a 32 y.o. female who [facility] / vomiting and abdominal pain.   U/S remarkable for cholelithiasis, borderline gb wall thickening and + Murphy's sign. [facility] by [facility]. Liver tests normal. WBC normal. Hgb 11.3 with normal MCV.  Patient underwent lap cholecystectomy with IOC this am. Gallbladder was edematous and thick.  IOC did not show contrast entering the duodenum c/w distal obstruction. Surgery attempted to dislodge the stone with a fogarty passed down cystic duct into CBD but catheter wouldn't pass the stone.   Patient has just returned from PACU. She is sleepy, doesn't remember [facility] talking to her about CBD stone.      Past Medical History:  Diagnosis Date  . Asthma     Past Surgical History:  Procedure Laterality Date  . CESAREAN SECTION    . TUBAL LIGATION      Prior to Admission medications   Medication Sig Start Date End Date Taking? Authorizing Provider  albuterol (PROAIR HFA) 108 (90 Base) MCG/ACT inhaler Inhale 2 puffs into the lungs every 4 (four) hours as needed for wheezing or shortness of breath. 11/01/17  Yes Ambs, Norvel RichardsAnne M, FNP  diphenhydrAMINE (BENADRYL) 50 MG capsule Take 50 mg by mouth every 6 (six) hours as needed for sleep.    Yes [provider]  Multiple Vitamin (MULTIVITAMIN WITH MINERALS) TABS tablet Take 1 tablet by mouth daily.   Yes [provider]  fluticasone (FLONASE) 50 MCG/ACT nasal spray Two sprays each nostril once a day for nasal congestion or drainage. Patient not taking: Reported on 07/09/2018 11/01/17   Hetty BlendAmbs, Anne M, FNP  fluticasone (FLOVENT HFA) 110 MCG/ACT inhaler Two puffs once a day with spacer to prevent cough or wheeze.  Rinse, gargle and spit after use. Patient not taking: Reported on 07/09/2018 11/01/17   Hetty BlendAmbs, Anne M, FNP  montelukast (SINGULAIR) 10 MG tablet One tablet at bedtime to prevent shortness of breath, cough and wheeze. Patient not  taking: Reported on 07/09/2018 11/01/17   Hetty Blend, FNP  Olopatadine HCl 0.2 % SOLN One drop each eye once a day as needed for red itchy eyes. Patient not taking: Reported on 07/09/2018 11/01/17   Hetty Blend, FNP    Current Facility-Administered Medications  Medication Dose Route Frequency Provider Last Rate Last Dose  . fentaNYL (SUBLIMAZE) 100 MCG/2ML injection           . acetaminophen  (OFIRMEV) IV 1,000 mg  1,000 mg Intravenous Once PRN Kaylyn Layer, MD      . albuterol (PROVENTIL) (2.5 MG/3ML) 0.083% nebulizer solution 3 mL  3 mL Inhalation Q4H PRN Ovidio Kin, MD      . Mitzi Hansen Hold] alum & mag hydroxide-simeth (MAALOX/MYLANTA) 200-200-20 MG/5ML suspension 30 mL  30 mL Oral Q6H PRN Ovidio Kin, MD   30 mL at 07/09/18 2156  . [MAR Hold] budesonide (PULMICORT) nebulizer solution 0.25 mg  0.25 mg Nebulization Daily PRN Ovidio Kin, MD      . cefTRIAXone (ROCEPHIN) 2 g in sodium chloride 0.9 % 100 mL IVPB  2 g Intravenous Q24H Ovidio Kin, MD      . dextrose 5 % and 0.45 % NaCl with KCl 20 mEq/L infusion   Intravenous Continuous Ovidio Kin, MD   Stopped at 07/10/18 [phone]  . diphenhydrAMINE (BENADRYL) capsule 50 mg  50 mg Oral Q6H PRN Ovidio Kin, MD      . fentaNYL (SUBLIMAZE) injection 25-50 mcg  25-50 mcg Intravenous Q5 min PRN Kaylyn Layer, MD   25 mcg at 07/10/18 1032  . lactated ringers infusion   Intravenous Continuous Kaylyn Layer, MD 125 mL/hr at 07/10/18 [phone]    . montelukast (SINGULAIR) tablet 10 mg  10 mg Oral QHS Ovidio Kin, MD   10 mg at 07/09/18 2156  . morphine 2 MG/ML injection 1-3 mg  1-3 mg Intravenous Q3H PRN Ovidio Kin, MD   2 mg at 07/10/18 0245  . ondansetron (ZOFRAN-ODT) disintegrating tablet 4 mg  4 mg Oral Q6H PRN Ovidio Kin, MD       Or  . ondansetron [facility]) injection 4 mg  4 mg Intravenous Q6H PRN Ovidio Kin, MD   4 mg at 07/10/18 0241  . oxyCODONE (Oxy IR/ROXICODONE) immediate release tablet 5 mg  5 mg Oral Once PRN Kaylyn Layer, MD       Or  . oxyCODONE (ROXICODONE) 5 MG/5ML solution 5 mg  5 mg Oral Once PRN Kaylyn Layer, MD      . oxyCODONE (Oxy IR/ROXICODONE) immediate release tablet 5-10 mg  5-10 mg Oral Q4H PRN Ovidio Kin, MD      . promethazine (PHENERGAN) injection 6.25-12.5 mg  6.25-12.5 mg Intravenous Q15 min PRN Kaylyn Layer, MD      . traMADol Janean Sark) tablet 50 mg  50 mg Oral Q6H PRN Ovidio Kin, MD   50 mg at 07/10/18 5409    Allergies as of 07/09/2018  . (No Known Allergies)    History reviewed. No pertinent family history.  Social History   Socioeconomic History  . Marital status: Single    Spouse name: Not on file  . Number of children: Not on file  . Years of education: Not on file  . Highest education level: Not on file  Occupational History  . Not on file  Social Needs  . Financial resource strain: Not on file  . Food insecurity:    Worry: Not on file  Inability: Not on file  . Transportation needs:    Medical: Not on file    Non-medical: Not on file  Tobacco Use  . Smoking status: Never Smoker  . Smokeless tobacco: Never Used  Substance and Sexual Activity  . Alcohol use: No  . Drug use: Yes    Types: Marijuana    Comment: daily  . Sexual activity: Not on file  Lifestyle  . Physical activity:    Days per week: Not on file    Minutes per session: Not on file  . Stress: Not on file  Relationships  . Social connections:    Talks on phone: Not on file    Gets together: Not on file    Attends religious service: Not on file    Active member of club or organization: Not on file    Attends meetings of clubs or organizations: Not on file    Relationship status: Not on file  . Intimate partner violence:    Fear of current or ex partner: Not on file    Emotionally abused: Not on file    Physically abused: Not on file    Forced sexual activity: Not on file  Other Topics Concern  . Not on file  Social History Narrative  . Not on file    Review of Systems: Not obtained. Patient just returned from PACU. She is not fully awake  Physical Exam: Vital signs in last 24 hours: Temp:  [98.1 F (36.7 C)-99.4 F (37.4 C)] 98.5 F (36.9 C) (11/24 1000) Pulse Rate:  [65-101] 91 (11/24 1038) Resp:  [11-20] 15 (11/24 1038) BP: (108-133)/(50-87) 128/80 (11/24 1030) SpO2:  [91 %-100 %] 91 % (11/24 1038) Last BM Date: 07/09/18 General:   Drowsy  female in NAD Psych:   cooperative. . Eyes:  Pupils equal, sclera clear, no icterus.   Conjunctiva pink. Ears:  Normal auditory acuity. Nose:  No deformity, discharge,  or lesions. Neck:  Supple; no masses Lungs:  Chest clear throughout to auscultation.  Marland Kitchen  Heart:  Regular rate and rhythm; no murmurs, no lower extremity edema Abdomen:  Post-op abdomen. Hypoactive bowel sounds    Rectal:  Deferred  Msk:  Symmetrical without gross deformities. . Neurologic:  Sleepy from surgery. Skin:  tattoos RUE and chest   Intake/Output from previous day: 11/23 0701 - 11/24 0700 In: 2199.3 [I.V.:1106.8; IV Piggyback:1092.4] Out: -  Intake/Output this shift: Total I/O In: 1300 [I.V.:1300] Out: 25 [Blood:25]  Lab Results: Recent Labs    07/09/18 1108  WBC 7.5  HGB 11.3*  HCT 36.7  PLT 175   BMET Recent Labs    07/09/18 1108  NA 140  K 3.7  CL 109  CO2 21*  GLUCOSE 133*  BUN 12  CREATININE 0.56  CALCIUM 9.3   LFT Recent Labs    07/09/18 1108  PROT 7.7  ALBUMIN 4.1  AST 26  ALT 17  ALKPHOS 33*  BILITOT 0.3     Studies/Results: US Abdomen Limited Ruq  Result Date: 07/09/2018 CLINICAL DATA:  32 year old presenting with epigastric abdominal pain, nausea and vomiting for the past 2 days that began after a large meal. EXAM: ULTRASOUND ABDOMEN LIMITED RIGHT UPPER QUADRANT COMPARISON:  None. FINDINGS: Gallbladder: Multiple shadowing gallstones within the gallbladder, the largest on the order of 2 cm in size. Borderline gallbladder wall thickening at 4 mm. Positive sonographic Murphy sign during the examination according to the ultrasound technologist. Common bile duct: Diameter: Approximately 3 mm. Liver: Normal size  and echotexture without focal parenchymal abnormality. Portal vein is patent on color Doppler imaging with normal direction of blood flow towards the liver. IMPRESSION: 1. Cholelithiasis. Borderline gallbladder wall thickening and positive sonographic Murphy sign  likely indicates acute cholecystitis. 2. Otherwise normal examination. Electronically Signed   By: Hulan Saas M.D.   On: 07/09/2018 12:32     Willette Cluster, NP-C @  07/10/2018, 10:42 AM

## 2018-07-10 NOTE — Transfer of Care (Signed)
Immediate Anesthesia Transfer of Care Note  Patient: Sarah Drake  Procedure(s) Performed: Procedure(s): LAPAROSCOPIC CHOLECYSTECTOMY WITH INTRAOPERATIVE CHOLANGIOGRAM, 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 (N/A)  Patient Location: PACU  Anesthesia Type:General  Level of Consciousness:  sedated, patient cooperative and responds to stimulation  Airway & Oxygen Therapy:Patient Spontanous Breathing and Patient connected to face mask oxgen  Post-op Assessment:  Report given to PACU RN and Post -op Vital signs reviewed and stable  Post vital signs:  Reviewed and stable  Last Vitals:  Vitals:   07/10/18 0148 07/10/18 0553  BP: 121/87 120/79  Pulse: 80 70  Resp: 16 16  Temp: 36.7 C 36.7 C  SpO2: 100% 100%    Complications: No apparent anesthesia complications

## 2018-07-10 NOTE — Op Note (Signed)
07/10/2018  9:48 AM  PATIENT:  Sarah Drake, 32 y.o., female, MRN: [id number]  PREOP DIAGNOSIS:  Cholecystitis, cholelthiasis  POSTOP DIAGNOSIS:   Acute cholecystitis, cholelthiasis, "white bile", choledocholithiasis  PROCEDURE:   Procedure(s) :  LAPAROSCOPIC CHOLECYSTECTOMY WITH INTRAOPERATIVE CHOLANGIOGRAM, EXPLORATION OF COMMON BILE DUCT WITH #4 BILIARY FOGARTY  SURGEON:   Ovidio Kin, M.D.  ASSISTANT:   None  ANESTHESIA:   general  Anesthesiologist: Kaylyn Layer, MD CRNA: Paris Lore, CRNA  General  ASA: 2  EBL:  minimal  ml  BLOOD ADMINISTERED: none  DRAINS: none   LOCAL MEDICATIONS USED:   30 cc of 1/4% marcaine  SPECIMEN:   Gall bladder  COUNTS CORRECT:  YES  INDICATIONS FOR PROCEDURE:  Sarah Drake is a 32 y.o. (DOB: 09-20-1985) AA female whose primary care physician is System, Pcp Not In and comes for cholecystectomy.   The indications and risks of the gall bladder surgery were explained to the patient.  The risks include, but are not limited to, infection, bleeding, common bile duct injury and open surgery.  SURGERY:  The patient was taken to OR room #1 at [facility].  The abdomen was prepped with chloroprep.  The patient was on Rocephin the beginning of the operation.   A time out was held and the surgical checklist run.   An infraumbilical incision was made into the abdominal cavity.  A 12 mm Hasson trocar was inserted into the abdominal cavity through the infraumbilical incision and secured with a 0 Vicryl suture.  Three additional trocars were inserted: a 10 mm trocar in the sub-xiphoid location, a 5 mm trocar in the right mid subcostal area, and a 5 mm trocar in the right lateral subcostal area.   The abdomen was explored and the liver, stomach, and bowel that could be seen were unremarkable.   The gall bladder was edematous and thickened.  I decompressed the gall bladder and got out "white bile".  Her gall bladder was packed  with stones.   I grasped the gall bladder and rotated it cephalad.  Disssection was carried down to the gall bladder/cystic duct junction and the cystic duct isolated.  A clip was placed on the gall bladder side of the cystic duct.   An intra-operative cholangiogram was shot.   The intra-operative cholangiogram was shot using a cut off Taut catheter placed through a 14 gauge angiocath in the RUQ.  The Taut catheter was inserted in the cut cystic duct and secured with an endoclip.  A cholangiogram was shot with 12 cc of 1/2 strength Isoview.  Using fluoroscopy, the cholangiogram showed the flow of contrast into the common bile duct, up the hepatic radicals, but did not enter the duodenum.  There was an obstruction in the distal CBD consistent with a retained stone.  I tried to dislodge the stone with a biliary fogarty that I passed down the cystic duct and into the common bile duct.  But I could not get the catheter past the stone.   The Taut catheter was removed.  The cystic duct was tripley endoclipped and the cystic artery was identified and clipped.  The gall bladder was bluntly and sharpley dissected from the gall bladder bed.   After the gall bladder was removed from the liver, the gall bladder bed and Triangle of [place] were inspected.  There was no bleeding or bile leak.  The gall bladder was placed in a Ecco Sac bag and delivered through the umbilicus.  The abdomen  was irrigated with 600 cc saline.   The trocars were then removed.  I infiltrated 30 cc of 1/4% Marcaine into the incisions.  The umbilical port closed with a 0 Vicryl suture and the skin closed with 4-0 Monocryl.  The skin was painted with DermaBond.  The patient's sponge and needle count were correct.  The patient was transported to the RR in good condition.    She will need a gastroenterology consult to consider ERCP.  Ovidio Kinavid Edel Rivero, MD, [facility] Pager: [phone] Office phone:  [phone]

## 2018-07-11 ENCOUNTER — Encounter (HOSPITAL_COMMUNITY): Admission: EM | Disposition: A | Discharge status: Home / Self Care | Payer: Self-pay | Source: Home / Self Care

## 2018-07-11 ENCOUNTER — Inpatient Hospital Stay (HOSPITAL_COMMUNITY): Payer: BLUE CROSS/BLUE SHIELD | Admitting: Anesthesiology

## 2018-07-11 ENCOUNTER — Inpatient Hospital Stay (HOSPITAL_COMMUNITY): Payer: BLUE CROSS/BLUE SHIELD

## 2018-07-11 ENCOUNTER — Encounter (HOSPITAL_COMMUNITY): Payer: Self-pay | Admitting: Surgery

## 2018-07-11 ENCOUNTER — Telehealth: Payer: Self-pay

## 2018-07-11 DIAGNOSIS — T85520A Displacement of bile duct prosthesis, initial encounter: Secondary | ICD-10-CM

## 2018-07-11 HISTORY — PX: REMOVAL OF STONES: SHX5545

## 2018-07-11 HISTORY — PX: BIOPSY: SHX5522

## 2018-07-11 HISTORY — PX: SPHINCTEROTOMY: SHX5544

## 2018-07-11 HISTORY — PX: PANCREATIC STENT PLACEMENT: SHX5539

## 2018-07-11 HISTORY — PX: BILIARY STENT PLACEMENT: SHX5538

## 2018-07-11 HISTORY — PX: BALLOON DILATION: SHX5330

## 2018-07-11 HISTORY — PX: ERCP: SHX5425

## 2018-07-11 LAB — COMPREHENSIVE METABOLIC PANEL
ALK PHOS: 83 U/L (ref 38–126)
ALT: 1079 U/L — ABNORMAL HIGH (ref 0–44)
AST: 1085 U/L — ABNORMAL HIGH (ref 15–41)
Albumin: 3.3 g/dL — ABNORMAL LOW (ref 3.5–5.0)
Anion gap: 8 (ref 5–15)
BILIRUBIN TOTAL: 2.9 mg/dL — AB (ref 0.3–1.2)
BUN: 5 mg/dL — AB (ref 6–20)
CO2: 26 mmol/L (ref 22–32)
CREATININE: 0.41 mg/dL — AB (ref 0.44–1.00)
Calcium: 8.4 mg/dL — ABNORMAL LOW (ref 8.9–10.3)
Chloride: 104 mmol/L (ref 98–111)
GFR calc Af Amer: >60 mL/min (ref >60)
Glucose, Bld: 102 mg/dL — ABNORMAL HIGH (ref 70–99)
Potassium: 3.6 mmol/L (ref 3.5–5.1)
SODIUM: 138 mmol/L (ref 135–145)
TOTAL PROTEIN: 6.4 g/dL — AB (ref 6.5–8.1)

## 2018-07-11 SURGERY — ERCP, WITH INTERVENTION IF INDICATED
Anesthesia: General

## 2018-07-11 MED ORDER — ROCURONIUM BROMIDE 10 MG/ML (PF) SYRINGE
PREFILLED_SYRINGE | INTRAVENOUS | Status: DC | PRN
  Administered 2018-07-11: 50 mg via INTRAVENOUS

## 2018-07-11 MED ORDER — PROPOFOL 10 MG/ML IV BOLUS
INTRAVENOUS | Status: DC | PRN
  Administered 2018-07-11: 170 mg via INTRAVENOUS

## 2018-07-11 MED ORDER — GLUCAGON HCL RDNA (DIAGNOSTIC) 1 MG IJ SOLR
INTRAMUSCULAR | Status: DC | PRN
  Administered 2018-07-11: .25 mg via INTRAVENOUS
  Administered 2018-07-11: 0.25 mg via INTRAVENOUS
  Administered 2018-07-11: .5 mg via INTRAVENOUS

## 2018-07-11 MED ORDER — PANTOPRAZOLE SODIUM 40 MG PO TBEC
40.0000 mg | DELAYED_RELEASE_TABLET | Freq: Every day | ORAL | Status: DC
  Administered 2018-07-11 – 2018-07-13 (×3): 40 mg via ORAL
  Filled 2018-07-11 (×3): qty 1

## 2018-07-11 MED ORDER — LACTATED RINGERS IV SOLN
INTRAVENOUS | Status: DC
  Administered 2018-07-11: 08:00:00 via INTRAVENOUS

## 2018-07-11 MED ORDER — ENOXAPARIN SODIUM 40 MG/0.4ML ~~LOC~~ SOLN
40.0000 mg | SUBCUTANEOUS | Status: DC
  Administered 2018-07-12 – 2018-07-13 (×2): 40 mg via SUBCUTANEOUS
  Filled 2018-07-11 (×2): qty 0.4

## 2018-07-11 MED ORDER — INDOMETHACIN 50 MG RE SUPP
RECTAL | Status: AC
  Filled 2018-07-11: qty 2

## 2018-07-11 MED ORDER — MIDAZOLAM HCL 2 MG/2ML IJ SOLN
INTRAMUSCULAR | Status: AC
  Filled 2018-07-11: qty 2

## 2018-07-11 MED ORDER — DEXAMETHASONE SODIUM PHOSPHATE 10 MG/ML IJ SOLN
INTRAMUSCULAR | Status: DC | PRN
  Administered 2018-07-11: 10 mg via INTRAVENOUS

## 2018-07-11 MED ORDER — FENTANYL CITRATE (PF) 250 MCG/5ML IJ SOLN
INTRAMUSCULAR | Status: AC
  Filled 2018-07-11: qty 5

## 2018-07-11 MED ORDER — MORPHINE SULFATE (PF) 4 MG/ML IV SOLN: 1.0000 mg | INTRAVENOUS | Status: DC | PRN

## 2018-07-11 MED ORDER — PROPOFOL 10 MG/ML IV BOLUS
INTRAVENOUS | Status: AC
  Filled 2018-07-11: qty 20

## 2018-07-11 MED ORDER — HYDROCODONE-ACETAMINOPHEN 5-325 MG PO TABS
1.0000 | ORAL_TABLET | ORAL | Status: DC | PRN
  Administered 2018-07-11 – 2018-07-12 (×2): 2 via ORAL
  Filled 2018-07-11 (×2): qty 2

## 2018-07-11 MED ORDER — INDOMETHACIN 50 MG RE SUPP
RECTAL | Status: DC | PRN
  Administered 2018-07-11: 100 mg via RECTAL

## 2018-07-11 MED ORDER — LIDOCAINE 2% (20 MG/ML) 5 ML SYRINGE
INTRAMUSCULAR | Status: DC | PRN
  Administered 2018-07-11: 50 mg via INTRAVENOUS

## 2018-07-11 MED ORDER — IBUPROFEN 200 MG PO TABS: 600.0000 mg | ORAL_TABLET | Freq: Four times a day (QID) | ORAL | Status: DC | PRN

## 2018-07-11 MED ORDER — MIDAZOLAM HCL 5 MG/5ML IJ SOLN
INTRAMUSCULAR | Status: DC | PRN
  Administered 2018-07-11: 2 mg via INTRAVENOUS

## 2018-07-11 MED ORDER — GLUCAGON HCL RDNA (DIAGNOSTIC) 1 MG IJ SOLR
INTRAMUSCULAR | Status: AC
  Filled 2018-07-11: qty 2

## 2018-07-11 MED ORDER — FENTANYL CITRATE (PF) 100 MCG/2ML IJ SOLN
INTRAMUSCULAR | Status: DC | PRN
  Administered 2018-07-11 (×4): 50 ug via INTRAVENOUS

## 2018-07-11 MED ORDER — ONDANSETRON HCL 4 MG/2ML IJ SOLN
INTRAMUSCULAR | Status: DC | PRN
  Administered 2018-07-11: 4 mg via INTRAVENOUS

## 2018-07-11 MED ORDER — SUGAMMADEX SODIUM 200 MG/2ML IV SOLN
INTRAVENOUS | Status: DC | PRN
  Administered 2018-07-11: 200 mg via INTRAVENOUS

## 2018-07-11 NOTE — Transfer of Care (Signed)
Immediate Anesthesia Transfer of Care Note  Patient: Sarah Drake  Procedure(s) Performed: Procedure(s) with comments: ENDOSCOPIC RETROGRADE CHOLANGIOPANCREATOGRAPHY (ERCP) (N/A) - Balloon Sweep  SPHINCTEROTOMY BALLOON DILATION (N/A) BILIARY STENT PLACEMENT (N/A) - 10x 7 PANCREATIC STENT PLACEMENT - 4x7 BIOPSY  Patient Location: PACU  Anesthesia Type:General  Level of Consciousness:  sedated, patient cooperative and responds to stimulation  Airway & Oxygen Therapy:Patient Spontanous Breathing and Patient connected to face mask oxgen  Post-op Assessment:  Report given to PACU RN and Post -op Vital signs reviewed and stable  Post vital signs:  Reviewed and stable  Last Vitals:  Vitals:   07/11/18 0415 07/11/18 0749  BP: 117/75 126/74  Pulse: (!) 57 66  Resp: 18 14  Temp: 37 C 37.1 C  SpO2: 100% 96%    Complications: No apparent anesthesia complications

## 2018-07-11 NOTE — Telephone Encounter (Signed)
-----   Message from Lemar LoftyGabriel Mansouraty Jr., MD sent at 07/11/2018 10:29 AM EST ----- Addiel Mccardle, let's schedule a 4-6 week ERCP for follow up and biliary stent removal and ensure no leak and no further stones present. Thanks. Liz BeachGabe

## 2018-07-11 NOTE — Anesthesia Preprocedure Evaluation (Signed)
Anesthesia Evaluation  Patient identified by MRN, date of birth, ID band Patient awake    Reviewed: Allergy & Precautions, NPO status , Patient's Chart, lab work & pertinent test results  Airway Mallampati: II  TM Distance: >3 FB Neck ROM: Full    Dental  (+) Teeth Intact, Dental Advisory Given   Pulmonary    breath sounds clear to auscultation       Cardiovascular  Rhythm:Regular Rate:Normal     Neuro/Psych    GI/Hepatic   Endo/Other    Renal/GU      Musculoskeletal   Abdominal   Peds  Hematology   Anesthesia Other Findings   Reproductive/Obstetrics                             Anesthesia Physical Anesthesia Plan  ASA: II  Anesthesia Plan: General   Post-op Pain Management:    Induction: Intravenous  PONV Risk Score and Plan: 1 and Ondansetron and Dexamethasone  Airway Management Planned: Oral ETT  Additional Equipment:   Intra-op Plan:   Post-operative Plan: Extubation in OR  Informed Consent: I have reviewed the patients History and Physical, chart, labs and discussed the procedure including the risks, benefits and alternatives for the proposed anesthesia with the patient or authorized representative who has indicated his/her understanding and acceptance.     Dental advisory given  Plan Discussed with: CRNA and Anesthesiologist  Anesthesia Plan Comments:         Anesthesia Quick Evaluation  

## 2018-07-11 NOTE — Progress Notes (Addendum)
[facility] Progress Note  Day of Surgery  Subjective: CC-  Patient states that she is sore but overall feeling ok. Tolerating liquids, ordering solid food for dinner. Denies any n/v. Passing some flatus. No BM since 11/22.  ERCP this morning with successful removal of common bile duct stone. Bile leak was also found therefore a plastic biliary stent was placed into the common bile duct and one temporary plastic pancreatic stent was placed into the ventral pancreatic duct to decrease risk of Post-ERCP pancreatitis.  Objective: Vital signs in last 24 hours: Temp:  [97.5 F (36.4 C)-98.7 F (37.1 C)] 98.1 F (36.7 C) (11/25 1406) Pulse Rate:  [55-78] 69 (11/25 1406) Resp:  [11-18] 18 (11/25 1406) BP: (109-135)/(61-78) 109/74 (11/25 1406) SpO2:  [92 %-100 %] 100 % (11/25 1308) Weight:  [93 kg] 93 kg (11/25 0749) Last BM Date: 07/10/18  Intake/Output from previous day: 11/24 0701 - 11/25 0700 In: 2947.9 [I.V.:2847.9; IV Piggyback:100] Out: 1825 [Urine:1800; Blood:25] Intake/Output this shift: Total I/O In: 4447.5 [P.O.:120; I.V.:4227.5; IV Piggyback:100] Out: 1410 [Urine:1400; Blood:10]  PE: Gen:  Alert, NAD, pleasant HEENT: EOM's intact, pupils equal and round Pulm:  effort normal Abd: Soft, NT/ND, +BS, lap incisions cdi without erythema or drainage Psych: A&Ox3  Skin: no rashes noted, warm and dry  Lab Results:  Recent Labs    07/09/18 1108  WBC 7.5  HGB 11.3*  HCT 36.7  PLT 175   BMET Recent Labs    07/09/18 1108 07/11/18 0408  NA 140 138  K 3.7 3.6  CL 109 104  CO2 21* 26  GLUCOSE 133* 102*  BUN 12 5*  CREATININE 0.56 0.41*  CALCIUM 9.3 8.4*   PT/INR No results for input(s): LABPROT, INR in the last 72 hours. CMP     Component Value Date/Time   NA 138 07/11/2018 0408   K 3.6 07/11/2018 0408   CL 104 07/11/2018 0408   CO2 26 07/11/2018 0408   GLUCOSE 102 (H) 07/11/2018 0408   BUN 5 (L) 07/11/2018 0408   CREATININE 0.41 (L)  07/11/2018 0408   CALCIUM 8.4 (L) 07/11/2018 0408   PROT 6.4 (L) 07/11/2018 0408   ALBUMIN 3.3 (L) 07/11/2018 0408   AST 1,085 (H) 07/11/2018 0408   ALT 1,079 (H) 07/11/2018 0408   ALKPHOS 83 07/11/2018 0408   BILITOT 2.9 (H) 07/11/2018 0408   GFRNONAA >60 07/11/2018 0408   GFRAA >60 07/11/2018 0408   Lipase     Component Value Date/Time   LIPASE 24 07/09/2018 1108       Studies/Results: Dg Cholangiogram Operative  Result Date: 07/10/2018 CLINICAL DATA:  Calculus cholecystitis EXAM: INTRAOPERATIVE CHOLANGIOGRAM TECHNIQUE: Cholangiographic images from the C-arm fluoroscopic device were submitted for interpretation post-operatively. Please see the procedural report for the amount of contrast and the fluoroscopy time utilized. COMPARISON:  07/09/2018 FINDINGS: Intraoperative cholangiogram performed during the laparoscopic procedure. The visualized intrahepatic ducts, biliary confluence, and common hepatic duct are normal. However, there is abrupt truncation of the common bile duct. Suspicious for obstructing choledocholithiasis. IMPRESSION: Persistent abrupt truncation of the common bile duct above the ampulla concerning for obstructing choledocholithiasis. Contrast does not drain spontaneously into the duodenum. Electronically Signed   By: Judie PetitM.  Shick M.D.   On: 07/10/2018 11:24   Dg Ercp With Sphincterotomy  Result Date: 07/11/2018 CLINICAL DATA:  Choledocholithiasis EXAM: ERCP TECHNIQUE: Multiple spot images obtained with the fluoroscopic device and submitted for interpretation post-procedure. COMPARISON:  Intra op cholangiogram 07/10/2018 FINDINGS: A series of fluoroscopic  spot images document endoscopic cannulation and opacification of the CBD. Guidewire placement into the pancreatic duct noted. Incomplete opacification of the intrahepatic biliary tree, which appears decompressed centrally. Subsequent images document balloon catheter passage through the CBD and placement of a plastic  biliary stent. No extravasation evident. IMPRESSION: Endoscopic CBD cannulation and intervention. These images were submitted for radiologic interpretation only. Please see the procedural report for the amount of contrast and the fluoroscopy time utilized. Electronically Signed   By: Corlis Leak M.D.   On: 07/11/2018 10:25    Anti-infectives: Anti-infectives (From admission, onward)   Start     Dose/Rate Route Frequency Ordered Stop   07/10/18 1400  cefTRIAXone (ROCEPHIN) 2 g in sodium chloride 0.9 % 100 mL IVPB     2 g 200 mL/hr over 30 Minutes Intravenous Every 24 hours 07/10/18 1118     07/10/18 1000  cefTRIAXone (ROCEPHIN) 2 g in sodium chloride 0.9 % 100 mL IVPB  Status:  Discontinued     2 g 200 mL/hr over 30 Minutes Intravenous Every 24 hours 07/09/18 1939 07/10/18 1118   07/09/18 1330  cefTRIAXone (ROCEPHIN) 2 g in sodium chloride 0.9 % 100 mL IVPB     2 g 200 mL/hr over 30 Minutes Intravenous  Once 07/09/18 1315 07/09/18 1421       Assessment/Plan Asthma  Acute cholecystitis, cholelthiasis, "white bile", choledocholithiasis S/p lap chole with IOC 11/24 Dr. Ezzard Standing S/p ERCP 11/25 Dr. Meridee Score -IOC + -ERCP with successful removal of common bile duct stone. Bile leak was also found therefore a plastic biliary stent was placed into the common bile duct and one temporary plastic pancreatic stent was placed into the ventral pancreatic duct to decrease risk of Post-ERCP pancreatitis -plan to continue antibiotics for 5 days post-procedure  ID - rocephin 11/23>> FEN - HH diet VTE - SCDs, lovenox Foley - none  Plan - Advance diet. Mobilize. Repeat labs in the AM.  Possibly ready for discharge tomorrow if tolerating diet, pain controlled with oral medications, and labs stable.   LOS: 2 days    Franne Forts , [facility] 07/11/2018, 4:30 PM Pager: [phone] Mon 7:00 am -11:30 AM  Agree with above. Now has stent. Discussed with Dr.  Meridee Score.  Ovidio Kin, MD, [facility] Pager: [phone] Office phone:  [phone]  Tues-Fri 7:00 am-4:30 pm Sat-Sun 7:00 am-11:30 am

## 2018-07-11 NOTE — Interval H&P Note (Signed)
History and Physical Interval Note:  07/11/2018 8:02 AM  Sarah Drake  has presented today for surgery, with the diagnosis of choledocholithiasis  The various methods of treatment have been discussed with the patient and family. After consideration of risks, benefits and other options for treatment, the patient has consented to  Procedure(s): ENDOSCOPIC RETROGRADE CHOLANGIOPANCREATOGRAPHY (ERCP) (N/A) as a surgical intervention .  The patient's history has been reviewed, patient examined, no change in status, stable for surgery.  I have reviewed the patient's chart and labs.  Questions were answered to the patient's satisfaction.    The risks of an ERCP were discussed at length, including but not limited to the risk of perforation, bleeding, abdominal pain, post-ERCP pancreatitis (while usually mild can be severe and even life threatening).    Gannett Coabriel Mansouraty Jr

## 2018-07-11 NOTE — Anesthesia Postprocedure Evaluation (Signed)
Anesthesia Post Note  Patient: Sarah Drake  Procedure(s) Performed: ENDOSCOPIC RETROGRADE CHOLANGIOPANCREATOGRAPHY (ERCP) (N/A ) SPHINCTEROTOMY BALLOON DILATION (N/A ) BILIARY STENT PLACEMENT (N/A ) PANCREATIC STENT PLACEMENT BIOPSY REMOVAL OF STONES     Patient location during evaluation: PACU Anesthesia Type: General Level of consciousness: awake and alert Pain management: pain level controlled Vital Signs Assessment: post-procedure vital signs reviewed and stable Respiratory status: spontaneous breathing, nonlabored ventilation, respiratory function stable and patient connected to nasal cannula oxygen Cardiovascular status: blood pressure returned to baseline and stable Postop Assessment: no apparent nausea or vomiting Anesthetic complications: no    Last Vitals:  Vitals:   07/11/18 1308 07/11/18 1406  BP: 119/72 109/74  Pulse: (!) 57 69  Resp: 18 18  Temp: (!) 36.4 C 36.7 C  SpO2: 100%     Last Pain:  Vitals:   07/11/18 1428  TempSrc:   PainSc: 5                  Divine Imber COKER

## 2018-07-11 NOTE — Discharge Instructions (Signed)
[facility] ° °Please arrive at least 30 min before your appointment to complete your check in paperwork.  If you are unable to arrive 30 min prior to your appointment time we may have to cancel or reschedule you. °LAPAROSCOPIC SURGERY: POST OP INSTRUCTIONS °Always review your discharge instruction sheet given to you by the facility where your surgery was performed. °IF YOU HAVE DISABILITY OR FAMILY LEAVE FORMS, YOU MUST BRING THEM TO THE OFFICE FOR PROCESSING.   °DO NOT GIVE THEM TO YOUR DOCTOR. ° °PAIN CONTROL ° °1. First take acetaminophen (Tylenol) AND/or ibuprofen (Advil) to control your pain after surgery.  Follow directions on package.  Taking acetaminophen (Tylenol) and/or ibuprofen (Advil) regularly after surgery will help to control your pain and lower the amount of prescription pain medication you may need.  You should not take more than 4,000 mg (4 grams) of acetaminophen (Tylenol) in 24 hours.  You should not take ibuprofen (Advil), aleve, motrin, naprosyn or other NSAIDS if you have a history of stomach ulcers or chronic kidney disease.  °2. A prescription for pain medication may be given to you upon discharge.  Take your pain medication as prescribed, if you still have uncontrolled pain after taking acetaminophen (Tylenol) or ibuprofen (Advil). °3. Use ice packs to help control pain. °4. If you need a refill on your pain medication, please contact your pharmacy.  They will contact our office to request authorization. Prescriptions will not be filled after 5pm or on week-ends. ° °HOME MEDICATIONS °5. Take your usually prescribed medications unless otherwise directed. ° °DIET °6. You should follow a light diet the first few days after arrival home.  Be sure to include lots of fluids daily. Avoid fatty, fried foods.  ° °CONSTIPATION °7. It is common to experience some constipation after surgery and if you are taking pain medication.  Increasing fluid intake and taking a stool  softener (such as Colace) will usually help or prevent this problem from occurring.  A mild laxative (Milk of Magnesia or Miralax) should be taken according to package instructions if there are no bowel movements after 48 hours. ° °WOUND/INCISION CARE °8. Most patients will experience some swelling and bruising in the area of the incisions.  Ice packs will help.  Swelling and bruising can take several days to resolve.  °9. Unless discharge instructions indicate otherwise, follow guidelines below  °a. STERI-STRIPS - you may remove your outer bandages 48 hours after surgery, and you may shower at that time.  You have steri-strips (small skin tapes) in place directly over the incision.  These strips should be left on the skin for 7-10 days.   °b. DERMABOND/SKIN GLUE - you may shower in 24 hours.  The glue will flake off over the next 2-3 weeks. °10. Any sutures or staples will be removed at the office during your follow-up visit. ° °ACTIVITIES °11. You may resume regular (light) daily activities beginning the next day--such as daily self-care, walking, climbing stairs--gradually increasing activities as tolerated.  You may have sexual intercourse when it is comfortable.  Refrain from any heavy lifting or straining until approved by your doctor. °a. You may drive when you are no longer taking prescription pain medication, you can comfortably wear a seatbelt, and you can safely maneuver your car and apply brakes. ° °FOLLOW-UP °12. You should see your doctor in the office for a follow-up appointment approximately 2-3 weeks after your surgery.  You should have been given your post-op/follow-up appointment when   your surgery was scheduled.  If you did not receive a post-op/follow-up appointment, make sure that you call for this appointment within a day or two after you arrive home to insure a convenient appointment time. ° °OTHER INSTRUCTIONS ° °WHEN TO CALL YOUR DOCTOR: °1. Fever over 101.0 °2. Inability to  urinate °3. Continued bleeding from incision. °4. Increased pain, redness, or drainage from the incision. °5. Increasing abdominal pain ° °The clinic staff is available to answer your questions during regular business hours.  Please don’t hesitate to call and ask to speak to one of the nurses for clinical concerns.  If you have a medical emergency, go to the nearest emergency room or call 911.  A surgeon from [facility] is always on call at the hospital. °[address], [address], [place], [place]  [address] ? [address] [address], [place], [place]   [address] °[phone] ? [phone] ? FAX [phone] ° ° ° °

## 2018-07-11 NOTE — Op Note (Addendum)
[facility] Patient Name: Sarah Drake Procedure Date: 07/11/2018 MRN: [id number] Attending MD: Justice Britain , MD Date of Birth: October 17, 1985 CSN: [id number] Age: 32 Admit Type: Outpatient Procedure:                ERCP Indications:              Bile duct stone(s), Filling defect on                            intraoperative cholangiogram, Postop exam:                            Cholecystectomy Providers:                Justice Britain, MD, Carlyn Reichert, RN, Charolette Child, Technician, Anne Fu CRNA, CRNA Referring MD:              Medicines:                General Anesthesia, Indomethacin 100 mg PR,                            Antibiotics are continued as per surgical service                            having already ordered Complications:            No immediate complications. Estimated Blood Loss:     Estimated blood loss was minimal. Procedure:                Pre-Anesthesia Assessment:                           - Prior to the procedure, a History and Physical                            was performed, and patient medications and                            allergies were reviewed. The patient's tolerance of                            previous anesthesia was also reviewed. The risks                            and benefits of the procedure and the sedation                            options and risks were discussed with the patient.                            All questions were answered, and informed consent                            was obtained. Prior Anticoagulants:  The patient has                            taken no previous anticoagulant or antiplatelet                            agents. ASA Grade Assessment: II - A patient with                            mild systemic disease. After reviewing the risks                            and benefits, the patient was deemed in                            satisfactory condition  to undergo the procedure.                           After obtaining informed consent, the scope was                            passed under direct vision. Throughout the                            procedure, the patient's blood pressure, pulse, and                            oxygen saturations were monitored continuously. The                            TJF-Q180V ([id number]) Olympus ERCP was introduced                            through the mouth, and used to inject contrast into                            and used to locate the major papilla. The ERCP was                            technically difficult and complex due to                            challenging cannulation. Successful completion of                            the procedure was aided by performing the maneuvers                            documented (below) in this report. The patient                            tolerated the procedure. Scope In: Scope Out: Findings:      The scout film was normal. The esophagus was successfully intubated  under direct vision without detailed examination of the pharynx, larynx,       and associated structures. The upper GI tract was traversed under direct       vision without detailed examination. Localized moderately erythematous       mucosa without bleeding was found in the cardia. No gross lesions were       noted in the entire examined stomach. No gross lesions were noted in the       duodenal bulb. The major papilla was bulging.      Repeated attempts at biliary cannulation were not successful while using       a wire-guided approach, and this led to placement of the wire in the       pancreatic duct on 1 single occasion. Decision was made to pursue a       double-wire approach. A short 0.035 inch Soft Jagwire was passed into       the ventral pancreatic duct. The ventral pancreatic duct was then       superficially cannulated with the short-nosed traction sphincterotome.        Contrast was injected. I personally interpreted the pancreatic duct       images. There was appropriate flow of contrast through the ducts. Image       quality was adequate. Contrast extended to the pancreatic duct.       Opacification of the ventral pancreatic duct in the head of the       pancreas, pancreatic duct in the genu of the pancreas and pancreatic       duct in the body of the pancreas was successful. The maximum diameter of       the ducts was 2 mm. The entire opacified area was normal.      A short 0.025 inch Revolution Jagwire was passed into the biliary tree.       The short-nosed traction sphincterotome was passed over the guidewire       and the bile duct was then deeply cannulated. I personally interpreted       the biliary ductal images. There was appropriate flow of contrast       through the duct. Image quality was adequate. Contrast was injected.       Opacification of the entire biliary tree except for the gallbladder was       successful. The maximum diameter of the ducts was 6 mm. The lower third       of the main bile duct contained filling defect thought to be a stone. A       7 mm biliary sphincterotomy was made with a monofilament short nose       sphincterotome using ERBE electrocautery (this was mostly of the outer       portion of the bulging papilla. There was no post-sphincterotomy       bleeding. Dilation of the distal common bile duct/biliary orifice with       an 03-25-09 mm x 3.0 cm CRE balloon (to a maximum balloon size of 8 mm)       dilator was successful for a total of 4 minutes as a sphincteroplasty.       The biliary tree was swept with a retrieval balloon starting at the       bifurcation. Sludge and debris was swept from the duct. One stone was       partially removed. An occlusion cholangiogram was performed that showed  Extravasation of contrast originating from the cystic duct. One 10 Fr by       7 cm plastic biliary stent with a single  external flap and a single       internal flap was placed into the common bile duct. Bile flowed through       the stent. The stent was in good position. One 4 Fr by 7 cm temporary       plastic pancreatic stent with a single external pigtail was placed into       the ventral pancreatic duct. The stent was in good position to decrease       risk of post-ERCP pancreatitis.      Biopsies were taken in the cardia, on the greater curvature of the       stomach, on the lesser curvature of the stomach, at the incisura and in       the gastric antrum through the ERCP scope with the cold forceps for       histology and H. pylori evaluation.      The duodenoscope was withdrawn from the patient. Impression:               - Erythematous mucosa in the cardia. No other gross                            lesions in the stomach. Biopsied to rule out H.                            pylori.                           - No gross lesions in the duodenal bulb.                           - The major papilla appeared to be bulging.                           - A filling defect consistent with a stone was seen                            on the cholangiogram.                           - Choledocholithiasis was found. Partial removal                            was accomplished with biliary sphincterotomy,                            balloon sphincteroplasty, and baloon sweeping.                           - A bile leak was found. One plastic biliary stent                            was placed into the common bile duct to aid in  healing the leak.                           - One temporary plastic pancreatic stent was placed                            into the ventral pancreatic duct to decrease risk                            of Post-ERCP Pancreatitis. Moderate Sedation:      Not Applicable - Patient had care per Anesthesia. Recommendation:           - The patient will be observed post-procedure,                             until all discharge criteria are met.                           - Return patient to hospital ward for ongoing care.                           - Would continue antibiotics for bile leak for at                            least 5-days total (Consider Ciprofloxacin 500 mg                            BID).                           - Repeat ERCP in 6 weeks to remove stent (we will                            arrange) and evaluate for leak improvement and to                            evaluate and clean biliary tree once again.                           - In 10-14 days, a KUB should be performed to                            evaluate for the Pancreatic Stent having fallen out.                           - Start Omeprazole 40 mg daily, continue for now.                           - Watch for pancreatitis, bleeding, perforation,                            and cholangitis.                           -  The findings and recommendations were discussed                            with the patient.                           - The findings and recommendations were discussed                            with the designated responsible adult. Procedure Code(s):        --- Professional ---                           [phone], Esophagogastroduodenoscopy, flexible,                            transoral; with biopsy, single or multiple Diagnosis Code(s):        --- Professional ---                           K31.89, Other diseases of stomach and duodenum                           R93.2, Abnormal findings on diagnostic imaging of                            liver and biliary tract                           K83.9, Disease of biliary tract, unspecified                           K80.50, Calculus of bile duct without cholangitis                            or cholecystitis without obstruction                           Z09, Encounter for follow-up examination after                            completed  treatment for conditions other than                            malignant neoplasm                           Z90.49, Acquired absence of other specified parts                            of digestive tract                           K83.8, Other specified diseases of biliary tract CPT copyright 2018 American Medical Association. All rights reserved. The codes documented in this report are preliminary and upon coder review may  be revised to meet current compliance  requirements. Justice Britain, MD 07/11/2018 10:28:26 AM Number of Addenda: 0

## 2018-07-11 NOTE — Telephone Encounter (Signed)
Also, Ethon Wymer, she needs a KUB flat/upright in 10-14 days to ensure pancreatic stent has fallen out, otherwise will need EGD to remove.  Thanks.  Liz BeachGabe

## 2018-07-11 NOTE — Anesthesia Procedure Notes (Signed)
Procedure Name: Intubation Date/Time: 07/11/2018 8:53 AM Performed by: Anne Fu, CRNA Pre-anesthesia Checklist: Patient identified, Emergency Drugs available, Suction available, Patient being monitored and Timeout performed Patient Re-evaluated:Patient Re-evaluated prior to induction Oxygen Delivery Method: Circle system utilized Preoxygenation: Pre-oxygenation with 100% oxygen Induction Type: IV induction Ventilation: Mask ventilation without difficulty Laryngoscope Size: Mac and 4 Grade View: Grade I Tube type: Oral Tube size: 7.5 mm Number of attempts: 1 Airway Equipment and Method: Stylet Placement Confirmation: ETT inserted through vocal cords under direct vision,  positive ETCO2 and breath sounds checked- equal and bilateral Secured at: 20 cm Tube secured with: Tape Dental Injury: Teeth and Oropharynx as per pre-operative assessment

## 2018-07-12 DIAGNOSIS — Z9889 Other specified postprocedural states: Secondary | ICD-10-CM

## 2018-07-12 DIAGNOSIS — K839 Disease of biliary tract, unspecified: Secondary | ICD-10-CM

## 2018-07-12 DIAGNOSIS — K8041 Calculus of bile duct with cholecystitis, unspecified, with obstruction: Secondary | ICD-10-CM

## 2018-07-12 DIAGNOSIS — K838 Other specified diseases of biliary tract: Secondary | ICD-10-CM

## 2018-07-12 DIAGNOSIS — R1084 Generalized abdominal pain: Secondary | ICD-10-CM

## 2018-07-12 DIAGNOSIS — K2900 Acute gastritis without bleeding: Secondary | ICD-10-CM

## 2018-07-12 LAB — CBC
HCT: 31.9 % — ABNORMAL LOW (ref 36.0–46.0)
Hemoglobin: 9.9 g/dL — ABNORMAL LOW (ref 12.0–15.0)
MCH: 30.3 pg (ref 26.0–34.0)
MCHC: 31 g/dL (ref 30.0–36.0)
MCV: 97.6 fL (ref 80.0–100.0)
NRBC: 0 % (ref 0.0–0.2)
PLATELETS: 171 10*3/uL (ref 150–400)
RBC: 3.27 MIL/uL — ABNORMAL LOW (ref 3.87–5.11)
RDW: 12.9 % (ref 11.5–15.5)
WBC: 6.3 10*3/uL (ref 4.0–10.5)

## 2018-07-12 LAB — LIPASE, BLOOD
LIPASE: 34 U/L (ref 11–51)
Lipase: 37 U/L (ref 11–51)

## 2018-07-12 LAB — COMPREHENSIVE METABOLIC PANEL
ALBUMIN: 3 g/dL — AB (ref 3.5–5.0)
ALT: 638 U/L — ABNORMAL HIGH (ref 0–44)
ANION GAP: 8 (ref 5–15)
AST: 218 U/L — ABNORMAL HIGH (ref 15–41)
Alkaline Phosphatase: 77 U/L (ref 38–126)
BUN: 6 mg/dL (ref 6–20)
CHLORIDE: 107 mmol/L (ref 98–111)
CO2: 25 mmol/L (ref 22–32)
Calcium: 8.6 mg/dL — ABNORMAL LOW (ref 8.9–10.3)
Creatinine, Ser: 0.58 mg/dL (ref 0.44–1.00)
GFR calc non Af Amer: >60 mL/min (ref >60)
GLUCOSE: 120 mg/dL — AB (ref 70–99)
Potassium: 3.5 mmol/L (ref 3.5–5.1)
SODIUM: 140 mmol/L (ref 135–145)
Total Bilirubin: 0.7 mg/dL (ref 0.3–1.2)
Total Protein: 6 g/dL — ABNORMAL LOW (ref 6.5–8.1)

## 2018-07-12 MED ORDER — AMOXICILLIN-POT CLAVULANATE 875-125 MG PO TABS: 1.0000 | ORAL_TABLET | Freq: Two times a day (BID) | ORAL | 0 refills | Status: DC

## 2018-07-12 MED ORDER — ACETAMINOPHEN 500 MG PO TABS: 1000.0000 mg | ORAL_TABLET | Freq: Three times a day (TID) | ORAL | 0 refills | Status: DC | PRN

## 2018-07-12 MED ORDER — IBUPROFEN 600 MG PO TABS: 600.0000 mg | ORAL_TABLET | Freq: Three times a day (TID) | ORAL | 0 refills | Status: DC | PRN

## 2018-07-12 MED ORDER — POLYETHYLENE GLYCOL 3350 17 G PO PACK: 17.0000 g | PACK | Freq: Every day | ORAL | 0 refills | Status: DC | PRN

## 2018-07-12 MED ORDER — ACETAMINOPHEN 500 MG PO TABS
1000.0000 mg | ORAL_TABLET | Freq: Three times a day (TID) | ORAL | Status: DC
  Administered 2018-07-12 – 2018-07-13 (×5): 1000 mg via ORAL
  Filled 2018-07-12 (×5): qty 2

## 2018-07-12 MED ORDER — IBUPROFEN 200 MG PO TABS
600.0000 mg | ORAL_TABLET | Freq: Three times a day (TID) | ORAL | Status: DC
  Administered 2018-07-12 – 2018-07-13 (×5): 600 mg via ORAL
  Filled 2018-07-12 (×5): qty 3

## 2018-07-12 MED ORDER — POLYETHYLENE GLYCOL 3350 17 G PO PACK
17.0000 g | PACK | Freq: Two times a day (BID) | ORAL | Status: DC
  Administered 2018-07-12 – 2018-07-13 (×2): 17 g via ORAL
  Filled 2018-07-12 (×2): qty 1

## 2018-07-12 MED ORDER — OXYCODONE HCL 5 MG PO TABS
5.0000 mg | ORAL_TABLET | ORAL | Status: DC | PRN
  Administered 2018-07-12: 10 mg via ORAL
  Filled 2018-07-12: qty 2

## 2018-07-12 MED ORDER — METHOCARBAMOL 500 MG PO TABS: 750.0000 mg | ORAL_TABLET | Freq: Three times a day (TID) | ORAL | Status: DC | PRN

## 2018-07-12 MED ORDER — POLYETHYLENE GLYCOL 3350 17 G PO PACK
17.0000 g | PACK | Freq: Every day | ORAL | Status: DC
  Administered 2018-07-12: 17 g via ORAL
  Filled 2018-07-12: qty 1

## 2018-07-12 MED ORDER — OXYCODONE HCL 5 MG PO TABS: 5.0000 mg | ORAL_TABLET | ORAL | 0 refills | Status: DC | PRN

## 2018-07-12 NOTE — Discharge Summary (Addendum)
[facility] Discharge Summary   Patient ID: Sarah Drake MRN: [id number] DOB/AGE: 1986-06-17 32 y.o.  Admit date: 07/09/2018 Discharge date: 07/13/2018  Admitting Diagnosis: Acute cholecystitis Choledocholithiasis  Discharge Diagnosis Patient Active Problem List   Diagnosis Date Noted  . Cholecystitis with cholelithiasis 07/09/2018  . Seasonal and perennial allergic rhinitis 11/01/2017  . Moderate persistent asthma without complication 05/31/2015  . Allergic rhinitis 05/31/2015    Consultants Gastroenterology  Imaging: Dg Ercp With Sphincterotomy  Result Date: 07/11/2018 CLINICAL DATA:  Choledocholithiasis EXAM: ERCP TECHNIQUE: Multiple spot images obtained with the fluoroscopic device and submitted for interpretation post-procedure. COMPARISON:  Intra op cholangiogram 07/10/2018 FINDINGS: A series of fluoroscopic spot images document endoscopic cannulation and opacification of the CBD. Guidewire placement into the pancreatic duct noted. Incomplete opacification of the intrahepatic biliary tree, which appears decompressed centrally. Subsequent images document balloon catheter passage through the CBD and placement of a plastic biliary stent. No extravasation evident. IMPRESSION: Endoscopic CBD cannulation and intervention. These images were submitted for radiologic interpretation only. Please see the procedural report for the amount of contrast and the fluoroscopy time utilized. Electronically Signed   By: Corlis Leak  Hassell M.D.   On: 07/11/2018 10:25    Procedures Dr. Ezzard StandingNewman (07/10/18) - Laparoscopic Cholecystectomy with IOC Dr. Meridee ScoreMansouraty (07/11/18) - ERCP  Hospital Course:  Sarah Drake is a 32yo female who [facility] with acute onset abdominal pain, nausea, and vomiting.  Workup showed cholelithiasis with borderline gallbladder wall thickening and positive sonographic Murphy on u/s.  Patient was admitted and underwent  laparoscopic cholecystectomy with intraoperative cholangiogram that was positive for choledocholithiasis. Tolerated procedure well and was transferred to the floor.  Gastroenterology was consulted and performed an ERCP on 11/25 during which a bile leak was found; one plastic biliary stent was placed into the common bile duct to aid in healing the leak, and one temporary plastic pancreatic stent was placed into the ventral pancreatic duct to decrease risk of post-ERCP pancreatitis. Patient returned to the floor. Diet was advanced as tolerated.  LFTs monitored and trended down post-ERCP. Pain control initially difficult but improved with multi-modal therapies. Patient also had issues with constipation that resolved with oral bowel regimen. On 11/27, the patient was voiding well, tolerating diet, ambulating well, pain well controlled, vital signs stable, incisions c/d/i and felt stable for discharge home.  Patient will follow up as below and knows to call with questions or concerns.      Allergies as of 07/13/2018   No Known Allergies     Medication List    STOP taking these medications   fluticasone 110 MCG/ACT inhaler Commonly known as:  FLOVENT HFA   fluticasone 50 MCG/ACT nasal spray Commonly known as:  FLONASE   montelukast 10 MG tablet Commonly known as:  SINGULAIR   Olopatadine HCl 0.2 % Soln     TAKE these medications   acetaminophen 500 MG tablet Commonly known as:  TYLENOL Take 2 tablets (1,000 mg total) by mouth every 8 (eight) hours as needed.   albuterol 108 (90 Base) MCG/ACT inhaler Commonly known as:  PROVENTIL HFA;VENTOLIN HFA Inhale 2 puffs into the lungs every 4 (four) hours as needed for wheezing or shortness of breath.   amoxicillin-clavulanate 875-125 MG tablet Commonly known as:  AUGMENTIN Take 1 tablet by mouth 2 (two) times daily for 4 days.   diphenhydrAMINE 50 MG capsule Commonly known as:  BENADRYL Take 50 mg by mouth every 6 (six) hours as  needed for  sleep.   ibuprofen 600 MG tablet Commonly known as:  ADVIL,MOTRIN Take 1 tablet (600 mg total) by mouth every 8 (eight) hours as needed.   multivitamin with minerals Tabs tablet Take 1 tablet by mouth daily.   oxyCODONE 5 MG immediate release tablet Commonly known as:  Oxy IR/ROXICODONE Take 1 tablet (5 mg total) by mouth every 4 (four) hours as needed for severe pain.   pantoprazole 40 MG tablet Commonly known as:  PROTONIX Take 1 tablet (40 mg total) by mouth daily. Start taking on:  07/14/2018   polyethylene glycol packet Commonly known as:  MIRALAX / GLYCOLAX Take 17 g by mouth daily as needed for moderate constipation.   polyethylene glycol packet Commonly known as:  MIRALAX / GLYCOLAX Take 17 g by mouth daily as needed for mild constipation.        Follow-up Information    [facility], [place]. Go on 07/26/2018.   Specialty:  General Surgery Why:  Your appointment is 07/26/18 at 8:30 am  Please arrive 30 minutes prior to your appointment to check in and fill out paperwork. Bring photo ID and insurance information. Contact information: [address] [place] [address] [phone]       Mansouraty, Netty Starring., MD. Call in 1 week(s).   Specialties:  Gastroenterology, Internal Medicine Contact information: [address] [place] [address] [phone]        [facility] Follow up on 07/13/2018.   Specialty:  Gastroenterology Why:   LABS- Go to lab in the basement of the building between hours of 8am and 4pm. .  Contact information: [address] [place] [address] [phone]       [facility]   Specialty:  Allergy and Immunology Contact information: [address] [place] [place] [address] [phone]           Signed: Franne Forts, [facility] 07/12/2018, 1:10 PM Pager: [phone]  Agree with  above. She appears to be ready to go home.  Ovidio Kin, MD, [facility] Pager: [phone] Office phone:  [phone]

## 2018-07-12 NOTE — Telephone Encounter (Signed)
Left message on machine to call back  

## 2018-07-12 NOTE — Progress Notes (Signed)
Progress Note   Subjective  Chief Complaint: Choledocholithiasis plus bile leak status post ERCP 07/11/2018  Today, patient tells me that she just "feels full".  Tells me she has not had a bowel movement since Friday morning, 07/08/2018.  Also explains that she has not eaten much because after taking a few bites she feels even more full.  Expresses a lot of "bubble/gas" in her upper abdomen.  Denies any abdominal pain.  Tells me overall she is feeling better.   Objective   Vital signs in last 24 hours: Temp:  [97.5 F (36.4 C)-98.5 F (36.9 C)] 98.4 F (36.9 C) (11/26 0944) Pulse Rate:  [55-70] 59 (11/26 0944) Resp:  [16-18] 16 (11/26 0944) BP: (101-126)/(66-77) 112/72 (11/26 0944) SpO2:  [98 %-100 %] 99 % (11/26 0944) Last BM Date: 07/11/18 General:   AA female in NAD Heart:  Regular rate and rhythm; no murmurs Lungs: Respirations even and unlabored, lungs CTA bilaterally Abdomen:  Soft, mild epigastric/RUQ ttp  And mild distension, decreased BS all four quadrants Extremities:  Without edema. Neurologic:  Alert and oriented,  grossly normal neurologically. Psych:  Cooperative. Normal mood and affect.  Intake/Output from previous day: 11/25 0701 - 11/26 0700 In: 6704.5 [P.O.:1260; I.V.:5344.5; IV Piggyback:100] Out: 4410 [Urine:4400; Blood:10] Intake/Output this shift: Total I/O In: 2891.4 [P.O.:120; I.V.:2771.4] Out: 0   Lab Results: Recent Labs    07/12/18 0332  WBC 6.3  HGB 9.9*  HCT 31.9*  PLT 171   BMET Recent Labs    07/11/18 0408 07/12/18 0332  NA 138 140  K 3.6 3.5  CL 104 107  CO2 26 25  GLUCOSE 102* 120*  BUN 5* 6  CREATININE 0.41* 0.58  CALCIUM 8.4* 8.6*   LFT Recent Labs    07/12/18 0332  PROT 6.0*  ALBUMIN 3.0*  AST 218*  ALT 638*  ALKPHOS 77  BILITOT 0.7   Studies/Results: Dg Ercp With Sphincterotomy  Result Date: 07/11/2018 CLINICAL DATA:  Choledocholithiasis EXAM: ERCP TECHNIQUE: Multiple spot images obtained with the  fluoroscopic device and submitted for interpretation post-procedure. COMPARISON:  Intra op cholangiogram 07/10/2018 FINDINGS: A series of fluoroscopic spot images document endoscopic cannulation and opacification of the CBD. Guidewire placement into the pancreatic duct noted. Incomplete opacification of the intrahepatic biliary tree, which appears decompressed centrally. Subsequent images document balloon catheter passage through the CBD and placement of a plastic biliary stent. No extravasation evident. IMPRESSION: Endoscopic CBD cannulation and intervention. These images were submitted for radiologic interpretation only. Please see the procedural report for the amount of contrast and the fluoroscopy time utilized. Electronically Signed   By: Corlis Leak  Hassell M.D.   On: 07/11/2018 10:25    Assessment / Plan:   Assessment: 1.  Choledocholithiasis plus bile leak: Status post ERCP with stent placement 07/11/2018, LFTs are trending down, patient is feeling fairly well  Plan: 1.  Continue antibiotics for bile leak for at least 5 days total, Ciprofloxacin 500 mg twice daily 2.  Repeat ERCP in 6 weeks to remove stent 3.  In 10 to 14 days a KUB should be performed to evaluate for the pancreatic stent having fallen out 4.  Continue Omeprazole 40 mg daily 5.  Continue to watch for pancreatitis, bleeding, perforation and/or cholangitis. 6.  Patient appears to be improving today, likely she can be discharged soon 7.  Will increase MiraLAX for the patient today, though I suspect her bowel movements will increase when she starts eating more 8.  Please await any  further recommendations from Dr. Meridee Score later today  Thank you for kind consultation, we will continue to follow along.   LOS: 3 days   Unk Lightning  07/12/2018, 11:29 AM

## 2018-07-12 NOTE — Progress Notes (Addendum)
[facility] Progress Note  1 Day Post-Op  Subjective: CC-  Patient states that she is having a lot of abdominal discomfort. Pain is mostly in her upper abdomen. Worse when she lies flat. Feels bloated. Denies n/v. Tolerating diet, but states that the food here does not taste very good. Passing some flatus, no BM. Ambulating to bathroom without issues.  LFTs trending down, lipase WNL.  Objective: Vital signs in last 24 hours: Temp:  [97.5 F (36.4 C)-98.6 F (37 C)] 98.2 F (36.8 C) (11/26 0558) Pulse Rate:  [55-78] 56 (11/26 0558) Resp:  [11-18] 18 (11/26 0558) BP: (101-135)/(62-77) 101/72 (11/26 0558) SpO2:  [92 %-100 %] 100 % (11/26 0558) Last BM Date: 07/11/18(pt reported "earlier today")  Intake/Output from previous day: 11/25 0701 - 11/26 0700 In: 6704.5 [P.O.:1260; I.V.:5344.5; IV Piggyback:100] Out: 4410 [Urine:4400; Blood:10] Intake/Output this shift: No intake/output data recorded.  PE: Gen:  Alert, NAD, pleasant HEENT: EOM's intact, pupils equal and round Pulm:  effort normal Abd: Soft, mild distension, +BS, lap incisions cdi without erythema or drainage, TTP RUQ/epigastric region/LUQ Psych: A&Ox3  Skin: no rashes noted, warm and dry  Lab Results:  Recent Labs    07/09/18 1108 07/12/18 0332  WBC 7.5 6.3  HGB 11.3* 9.9*  HCT 36.7 31.9*  PLT 175 171   BMET Recent Labs    07/11/18 0408 07/12/18 0332  NA 138 140  K 3.6 3.5  CL 104 107  CO2 26 25  GLUCOSE 102* 120*  BUN 5* 6  CREATININE 0.41* 0.58  CALCIUM 8.4* 8.6*   PT/INR No results for input(s): LABPROT, INR in the last 72 hours. CMP     Component Value Date/Time   NA 140 07/12/2018 0332   K 3.5 07/12/2018 0332   CL 107 07/12/2018 0332   CO2 25 07/12/2018 0332   GLUCOSE 120 (H) 07/12/2018 0332   BUN 6 07/12/2018 0332   CREATININE 0.58 07/12/2018 0332   CALCIUM 8.6 (L) 07/12/2018 0332   PROT 6.0 (L) 07/12/2018 0332   ALBUMIN 3.0 (L) 07/12/2018 0332   AST 218 (H)  07/12/2018 0332   ALT 638 (H) 07/12/2018 0332   ALKPHOS 77 07/12/2018 0332   BILITOT 0.7 07/12/2018 0332   GFRNONAA >60 07/12/2018 0332   GFRAA >60 07/12/2018 0332   Lipase     Component Value Date/Time   LIPASE 37 07/12/2018 0332       Studies/Results: Dg Cholangiogram Operative  Result Date: 07/10/2018 CLINICAL DATA:  Calculus cholecystitis EXAM: INTRAOPERATIVE CHOLANGIOGRAM TECHNIQUE: Cholangiographic images from the C-arm fluoroscopic device were submitted for interpretation post-operatively. Please see the procedural report for the amount of contrast and the fluoroscopy time utilized. COMPARISON:  07/09/2018 FINDINGS: Intraoperative cholangiogram performed during the laparoscopic procedure. The visualized intrahepatic ducts, biliary confluence, and common hepatic duct are normal. However, there is abrupt truncation of the common bile duct. Suspicious for obstructing choledocholithiasis. IMPRESSION: Persistent abrupt truncation of the common bile duct above the ampulla concerning for obstructing choledocholithiasis. Contrast does not drain spontaneously into the duodenum. Electronically Signed   By: Judie PetitM.  Shick M.D.   On: 07/10/2018 11:24   Dg Ercp With Sphincterotomy  Result Date: 07/11/2018 CLINICAL DATA:  Choledocholithiasis EXAM: ERCP TECHNIQUE: Multiple spot images obtained with the fluoroscopic device and submitted for interpretation post-procedure. COMPARISON:  Intra op cholangiogram 07/10/2018 FINDINGS: A series of fluoroscopic spot images document endoscopic cannulation and opacification of the CBD. Guidewire placement into the pancreatic duct noted. Incomplete opacification of the intrahepatic biliary tree,  which appears decompressed centrally. Subsequent images document balloon catheter passage through the CBD and placement of a plastic biliary stent. No extravasation evident. IMPRESSION: Endoscopic CBD cannulation and intervention. These images were submitted for radiologic  interpretation only. Please see the procedural report for the amount of contrast and the fluoroscopy time utilized. Electronically Signed   By: Corlis Leak M.D.   On: 07/11/2018 10:25    Anti-infectives: Anti-infectives (From admission, onward)   Start     Dose/Rate Route Frequency Ordered Stop   07/10/18 1400  cefTRIAXone (ROCEPHIN) 2 g in sodium chloride 0.9 % 100 mL IVPB     2 g 200 mL/hr over 30 Minutes Intravenous Every 24 hours 07/10/18 1118     07/10/18 1000  cefTRIAXone (ROCEPHIN) 2 g in sodium chloride 0.9 % 100 mL IVPB  Status:  Discontinued     2 g 200 mL/hr over 30 Minutes Intravenous Every 24 hours 07/09/18 1939 07/10/18 1118   07/09/18 1330  cefTRIAXone (ROCEPHIN) 2 g in sodium chloride 0.9 % 100 mL IVPB     2 g 200 mL/hr over 30 Minutes Intravenous  Once 07/09/18 1315 07/09/18 1421       Assessment/Plan Asthma  Acute cholecystitis, cholelthiasis, "white bile", choledocholithiasis S/p lap chole with IOC 11/24 Dr. Ezzard Standing S/p ERCP 11/25 Dr. Meridee Score  -IOC +  -ERCP with successful removal of common bile duct stone. Bile leak was also found therefore a plastic biliary stent was placed into the common bile duct and one temporary plastic pancreatic stent was placed into the ventral pancreatic duct to decrease risk of Post-ERCP pancreatitis  -plan to continue antibiotics for 5 days post-procedure  -LFTs trending down post-procedure  ID - rocephin 11/23>> FEN - reg diet VTE - SCDs, lovenox Foley - none  Plan - Change norco to oxy and schedule tylenol/ibuprofen. Encouraged patient to ambulate more frequently. Start miralax.   May be ready for discharge this afternoon vs tomorrow depending on pain control.   LOS: 3 days    Franne Forts , [facility] 07/12/2018, 9:06 AM Pager: [phone]  Agree with above. She may need to stay another day secondary to pain.  Ovidio Kin, MD, [facility] Pager: [phone] Office  phone:  [phone]

## 2018-07-13 ENCOUNTER — Encounter (HOSPITAL_COMMUNITY): Payer: Self-pay | Admitting: Gastroenterology

## 2018-07-13 ENCOUNTER — Other Ambulatory Visit: Payer: Self-pay | Admitting: Nurse Practitioner

## 2018-07-13 ENCOUNTER — Encounter: Payer: Self-pay | Admitting: Gastroenterology

## 2018-07-13 DIAGNOSIS — K5900 Constipation, unspecified: Secondary | ICD-10-CM

## 2018-07-13 DIAGNOSIS — K838 Other specified diseases of biliary tract: Secondary | ICD-10-CM

## 2018-07-13 LAB — COMPREHENSIVE METABOLIC PANEL
ALK PHOS: 74 U/L (ref 38–126)
ALT: 389 U/L — ABNORMAL HIGH (ref 0–44)
ANION GAP: 6 (ref 5–15)
AST: 50 U/L — AB (ref 15–41)
Albumin: 3.2 g/dL — ABNORMAL LOW (ref 3.5–5.0)
BILIRUBIN TOTAL: 0.4 mg/dL (ref 0.3–1.2)
BUN: 8 mg/dL (ref 6–20)
CO2: 28 mmol/L (ref 22–32)
Calcium: 8.6 mg/dL — ABNORMAL LOW (ref 8.9–10.3)
Chloride: 106 mmol/L (ref 98–111)
Creatinine, Ser: 0.46 mg/dL (ref 0.44–1.00)
GFR calc Af Amer: >60 mL/min (ref >60)
GFR calc non Af Amer: >60 mL/min (ref >60)
Glucose, Bld: 100 mg/dL — ABNORMAL HIGH (ref 70–99)
POTASSIUM: 3.7 mmol/L (ref 3.5–5.1)
SODIUM: 140 mmol/L (ref 135–145)
Total Protein: 6.6 g/dL (ref 6.5–8.1)

## 2018-07-13 LAB — CBC
HEMATOCRIT: 32.7 % — AB (ref 36.0–46.0)
HEMOGLOBIN: 10.2 g/dL — AB (ref 12.0–15.0)
MCH: 30.6 pg (ref 26.0–34.0)
MCHC: 31.2 g/dL (ref 30.0–36.0)
MCV: 98.2 fL (ref 80.0–100.0)
NRBC: 0 % (ref 0.0–0.2)
Platelets: 160 10*3/uL (ref 150–400)
RBC: 3.33 MIL/uL — ABNORMAL LOW (ref 3.87–5.11)
RDW: 13 % (ref 11.5–15.5)
WBC: 3.9 10*3/uL — ABNORMAL LOW (ref 4.0–10.5)

## 2018-07-13 MED ORDER — POLYETHYLENE GLYCOL 3350 17 G PO PACK: 17.0000 g | PACK | Freq: Every day | ORAL | 0 refills | Status: DC | PRN

## 2018-07-13 MED ORDER — POLYETHYLENE GLYCOL 3350 17 G PO PACK
17.0000 g | PACK | Freq: Three times a day (TID) | ORAL | Status: DC
  Administered 2018-07-13 (×2): 17 g via ORAL
  Filled 2018-07-13 (×2): qty 1

## 2018-07-13 MED ORDER — AMOXICILLIN-POT CLAVULANATE 875-125 MG PO TABS: 1.0000 | ORAL_TABLET | Freq: Two times a day (BID) | ORAL | 0 refills | Status: AC

## 2018-07-13 MED ORDER — PANTOPRAZOLE SODIUM 40 MG PO TBEC: 40.0000 mg | DELAYED_RELEASE_TABLET | Freq: Every day | ORAL | 0 refills | Status: DC

## 2018-07-13 MED ORDER — MAGNESIUM CITRATE PO SOLN: 1.0000 | Freq: Once | ORAL | Status: DC

## 2018-07-13 NOTE — Progress Notes (Addendum)
[facility] Progress Note  2 Days Post-Op  Subjective: CC-  States that her abdomen is still very sore, but feels a little better today. Passing flatus. Concerned that she has not had a BM since last Friday. Denies any n/v. Tolerating diet. States that she ate a quesadilla and baked fries for dinner last night. She has been ambulating in the halls.  LFTs trending down.  Objective: Vital signs in last 24 hours: Temp:  [98.2 F (36.8 C)-98.5 F (36.9 C)] 98.3 F (36.8 C) (11/27 0518) Pulse Rate:  [59-75] 71 (11/27 0518) Resp:  [16-18] 18 (11/27 0518) BP: (112-133)/(72-73) 125/73 (11/27 0518) SpO2:  [99 %-100 %] 100 % (11/27 0518) Last BM Date: 07/11/18  Intake/Output from previous day: 11/26 0701 - 11/27 0700 In: 6825.1 [P.O.:1165; I.V.:5648.4; IV Piggyback:11.7] Out: 2900 [Urine:2900] Intake/Output this shift: No intake/output data recorded.  PE: Gen: Alert, NAD, pleasant HEENT: EOM's intact, pupils equal and round Pulm: effort normal Abd: Soft, mild distension, +BS,lap incisions cdi without erythema or drainage, mild lower abdominal TTP Psych: A&Ox3  Skin: no rashes noted, warm and dry  Lab Results:  Recent Labs    07/12/18 0332 07/13/18 0441  WBC 6.3 3.9*  HGB 9.9* 10.2*  HCT 31.9* 32.7*  PLT 171 160   BMET Recent Labs    07/12/18 0332 07/13/18 0441  NA 140 140  K 3.5 3.7  CL 107 106  CO2 25 28  GLUCOSE 120* 100*  BUN 6 8  CREATININE 0.58 0.46  CALCIUM 8.6* 8.6*   PT/INR No results for input(s): LABPROT, INR in the last 72 hours. CMP     Component Value Date/Time   NA 140 07/13/2018 0441   K 3.7 07/13/2018 0441   CL 106 07/13/2018 0441   CO2 28 07/13/2018 0441   GLUCOSE 100 (H) 07/13/2018 0441   BUN 8 07/13/2018 0441   CREATININE 0.46 07/13/2018 0441   CALCIUM 8.6 (L) 07/13/2018 0441   PROT 6.6 07/13/2018 0441   ALBUMIN 3.2 (L) 07/13/2018 0441   AST 50 (H) 07/13/2018 0441   ALT 389 (H) 07/13/2018 0441   ALKPHOS 74  07/13/2018 0441   BILITOT 0.4 07/13/2018 0441   GFRNONAA >60 07/13/2018 0441   GFRAA >60 07/13/2018 0441   Lipase     Component Value Date/Time   LIPASE 34 07/12/2018 1250       Studies/Results: Dg Ercp With Sphincterotomy  Result Date: 07/11/2018 CLINICAL DATA:  Choledocholithiasis EXAM: ERCP TECHNIQUE: Multiple spot images obtained with the fluoroscopic device and submitted for interpretation post-procedure. COMPARISON:  Intra op cholangiogram 07/10/2018 FINDINGS: A series of fluoroscopic spot images document endoscopic cannulation and opacification of the CBD. Guidewire placement into the pancreatic duct noted. Incomplete opacification of the intrahepatic biliary tree, which appears decompressed centrally. Subsequent images document balloon catheter passage through the CBD and placement of a plastic biliary stent. No extravasation evident. IMPRESSION: Endoscopic CBD cannulation and intervention. These images were submitted for radiologic interpretation only. Please see the procedural report for the amount of contrast and the fluoroscopy time utilized. Electronically Signed   By: Corlis Leak  Hassell M.D.   On: 07/11/2018 10:25    Anti-infectives: Anti-infectives (From admission, onward)   Start     Dose/Rate Route Frequency Ordered Stop   07/12/18 0000  amoxicillin-clavulanate (AUGMENTIN) 875-125 MG tablet     1 tablet Oral 2 times daily 07/12/18 0912 07/17/18 2359   07/10/18 1400  cefTRIAXone (ROCEPHIN) 2 g in sodium chloride 0.9 % 100 mL IVPB  2 g 200 mL/hr over 30 Minutes Intravenous Every 24 hours 07/10/18 1118     07/10/18 1000  cefTRIAXone (ROCEPHIN) 2 g in sodium chloride 0.9 % 100 mL IVPB  Status:  Discontinued     2 g 200 mL/hr over 30 Minutes Intravenous Every 24 hours 07/09/18 1939 07/10/18 1118   07/09/18 1330  cefTRIAXone (ROCEPHIN) 2 g in sodium chloride 0.9 % 100 mL IVPB     2 g 200 mL/hr over 30 Minutes Intravenous  Once 07/09/18 1315 07/09/18 1421        Assessment/Plan Asthma  Acute cholecystitis, cholelthiasis, "white bile", choledocholithiasis S/p lap chole with IOC 11/24 Dr. Ezzard Standing S/p ERCP 11/25 Dr. Meridee Score             -IOC +             -ERCP with successful removal of common bile duct stone. Bile leak wasalsofoundtherefore aplastic biliary stent was placed into the common bile ductand one temporary plastic pancreatic stent was placed into the ventral pancreatic duct to decrease risk of Post-ERCPpancreatitis             -plan to continue antibiotics for 5 days post-procedure             -LFTs trending down post-procedure  ID -rocephin 11/23>> FEN -reg diet VTE -SCDs, lovenox Foley -none  Plan- Continue BID miralax and ambulation.   Will recheck patient later today for possible discharge depending on bowel function.   LOS: 4 days    Franne Forts , [facility] 07/13/2018, 7:54 AM Pager: [phone]  Agree with above. Probably home this afternoon.  Ovidio Kin, MD, [facility] Pager: [phone] Office phone:  [phone]

## 2018-07-13 NOTE — Telephone Encounter (Signed)
The pt was notified and will come in 2 weeks for KUB and will be contacted in 4-6 weeks for ERCP

## 2018-07-13 NOTE — Progress Notes (Signed)
     [facility] Progress Note   Chief Complaint:   Bile duct stone / bile duct leak    SUBJECTIVE:       ASSESSMENT AND PLAN:    1. 32 yo female with acute cholecystitis / cholelithiasis, s/p lap cholecystectomy 07/10/18. IOC positive for distal CBD obstruction. She is s/p ERCP with stone removal , sphincterotomy, occlusion cholangiogram, and biliary stent placement for cystic duct leak on 07/11/18. She feels okay, liver tests continue to improve. -no significant abdominal pain. Her only concern is that of constipation, see #2.  -Our office is making arrangements for KUB in 2 weeks and ERCP for stent removal in  4-6 weeks. -additionally she will need follow up liver tests at office in one week. Will put in discharge instructions.   2. Constipation. No BM in days. Taking BID Miralax.  -increasing miralax to TID  OBJECTIVE:     Vital signs in last 24 hours: Temp:  [98.2 F (36.8 C)-98.5 F (36.9 C)] 98.3 F (36.8 C) (11/27 0518) Pulse Rate:  [59-75] 71 (11/27 0518) Resp:  [16-18] 18 (11/27 0518) BP: (112-133)/(72-73) 125/73 (11/27 0518) SpO2:  [99 %-100 %] 100 % (11/27 0518) Last BM Date: 07/11/18 General:   Alert, well-developed female in NAD EENT:  Normal hearing, non icteric sclera, conjunctive pink.  Heart:  Regular rate and rhythm; no murmur.  No lower extremity edema   Pulm: Normal respiratory effort, lungs CTA bilaterally without wheezes or crackles. Abdomen:  Soft, nondistended. Normal bowel sounds, no masses felt.       Neurologic:  Alert and  oriented x4;  grossly normal neurologically. Psych:  Pleasant, cooperative.  Normal mood and affect.   Intake/Output from previous day: 11/26 0701 - 11/27 0700 In: 6825.1 [P.O.:1165; I.V.:5648.4; IV Piggyback:11.7] Out: 2900 [Urine:2900] Intake/Output this shift: No intake/output data recorded.  Lab Results: Recent Labs    07/12/18 0332 07/13/18 0441  WBC 6.3 3.9*  HGB 9.9* 10.2*  HCT 31.9* 32.7*   PLT 171 160   BMET Recent Labs    07/11/18 0408 07/12/18 0332 07/13/18 0441  NA 138 140 140  K 3.6 3.5 3.7  CL 104 107 106  CO2 26 25 28   GLUCOSE 102* 120* 100*  BUN 5* 6 8  CREATININE 0.41* 0.58 0.46  CALCIUM 8.4* 8.6* 8.6*   LFT Recent Labs    07/13/18 0441  PROT 6.6  ALBUMIN 3.2*  AST 50*  ALT 389*  ALKPHOS 74  BILITOT 0.4    Dg Ercp With Sphincterotomy  Result Date: 07/11/2018 CLINICAL DATA:  Choledocholithiasis EXAM: ERCP TECHNIQUE: Multiple spot images obtained with the fluoroscopic device and submitted for interpretation post-procedure. COMPARISON:  Intra op cholangiogram 07/10/2018 FINDINGS: A series of fluoroscopic spot images document endoscopic cannulation and opacification of the CBD. Guidewire placement into the pancreatic duct noted. Incomplete opacification of the intrahepatic biliary tree, which appears decompressed centrally. Subsequent images document balloon catheter passage through the CBD and placement of a plastic biliary stent. No extravasation evident. IMPRESSION: Endoscopic CBD cannulation and intervention. These images were submitted for radiologic interpretation only. Please see the procedural report for the amount of contrast and the fluoroscopy time utilized. Electronically Signed   By: Corlis Leak  Hassell M.D.   On: 07/11/2018 10:25     LOS: 4 days   Willette ClusterPaula Guenther ,NP 07/13/2018, 8:53 AM

## 2018-07-13 NOTE — Progress Notes (Signed)
Discharge instructions given to pt and all questions were answered.  

## 2018-08-02 ENCOUNTER — Telehealth: Payer: Self-pay | Admitting: Gastroenterology

## 2018-08-02 NOTE — Telephone Encounter (Signed)
The pt was advised that the order is in Epic for xray and she can come in at her convenience and we will contact her when time for ERCP. The pt has been advised of the information and verbalized understanding.

## 2018-08-09 ENCOUNTER — Ambulatory Visit (INDEPENDENT_AMBULATORY_CARE_PROVIDER_SITE_OTHER)
Admission: RE | Admit: 2018-08-09 | Discharge: 2018-08-09 | Disposition: A | Discharge status: Home / Self Care | Payer: BLUE CROSS/BLUE SHIELD | Source: Ambulatory Visit | Attending: Gastroenterology | Admitting: Gastroenterology

## 2018-08-09 DIAGNOSIS — T85520A Displacement of bile duct prosthesis, initial encounter: Secondary | ICD-10-CM | POA: Diagnosis not present

## 2018-08-12 ENCOUNTER — Other Ambulatory Visit: Payer: Self-pay | Admitting: Gastroenterology

## 2018-08-12 DIAGNOSIS — K838 Other specified diseases of biliary tract: Secondary | ICD-10-CM

## 2018-08-23 ENCOUNTER — Telehealth: Payer: Self-pay

## 2018-08-23 NOTE — Telephone Encounter (Signed)
-----   Message from Loretha Stapler, RN sent at 07/12/2018 10:35 AM EST -----  ERCP for follow up and biliary stent removal and ensure no leak and no further stones present.

## 2018-08-23 NOTE — Telephone Encounter (Signed)
1/29 ERCP scheduled

## 2018-10-04 ENCOUNTER — Encounter (HOSPITAL_COMMUNITY): Payer: Self-pay

## 2018-10-04 NOTE — Anesthesia Preprocedure Evaluation (Addendum)
Anesthesia Evaluation  Patient identified by MRN, date of birth, ID band Patient awake    Reviewed: Allergy & Precautions, NPO status , Patient's Chart, lab work & pertinent test results  Airway Mallampati: II  TM Distance: >3 FB     Dental   Pulmonary asthma ,    breath sounds clear to auscultation       Cardiovascular negative cardio ROS   Rhythm:Regular Rate:Normal     Neuro/Psych    GI/Hepatic negative GI ROS, Neg liver ROS,   Endo/Other  negative endocrine ROS  Renal/GU negative Renal ROS     Musculoskeletal   Abdominal   Peds  Hematology  (+) anemia ,   Anesthesia Other Findings   Reproductive/Obstetrics                            Anesthesia Physical Anesthesia Plan  ASA: III  Anesthesia Plan: General   Post-op Pain Management:    Induction: Intravenous  PONV Risk Score and Plan: 3 and Ondansetron, Dexamethasone and Midazolam  Airway Management Planned: Oral ETT  Additional Equipment:   Intra-op Plan:   Post-operative Plan: Extubation in OR  Informed Consent: I have reviewed the patients History and Physical, chart, labs and discussed the procedure including the risks, benefits and alternatives for the proposed anesthesia with the patient or authorized representative who has indicated his/her understanding and acceptance.     Dental advisory given  Plan Discussed with: Anesthesiologist and CRNA  Anesthesia Plan Comments:        Anesthesia Quick Evaluation

## 2018-10-05 ENCOUNTER — Encounter (HOSPITAL_COMMUNITY): Payer: Self-pay | Admitting: Certified Registered Nurse Anesthetist

## 2018-10-05 ENCOUNTER — Ambulatory Visit (HOSPITAL_COMMUNITY): Payer: BLUE CROSS/BLUE SHIELD | Admitting: Anesthesiology

## 2018-10-05 ENCOUNTER — Other Ambulatory Visit: Payer: Self-pay

## 2018-10-05 ENCOUNTER — Ambulatory Visit (HOSPITAL_COMMUNITY)
Admission: RE | Admit: 2018-10-05 | Discharge: 2018-10-05 | Disposition: A | Discharge status: Home / Self Care | Payer: BLUE CROSS/BLUE SHIELD | Attending: Gastroenterology | Admitting: Gastroenterology

## 2018-10-05 ENCOUNTER — Encounter (HOSPITAL_COMMUNITY)
Admission: RE | Disposition: A | Discharge status: Home / Self Care | Payer: Self-pay | Source: Home / Self Care | Attending: Gastroenterology

## 2018-10-05 ENCOUNTER — Telehealth: Payer: Self-pay

## 2018-10-05 ENCOUNTER — Ambulatory Visit (HOSPITAL_COMMUNITY): Payer: BLUE CROSS/BLUE SHIELD

## 2018-10-05 DIAGNOSIS — Z4659 Encounter for fitting and adjustment of other gastrointestinal appliance and device: Secondary | ICD-10-CM | POA: Insufficient documentation

## 2018-10-05 DIAGNOSIS — R932 Abnormal findings on diagnostic imaging of liver and biliary tract: Secondary | ICD-10-CM | POA: Diagnosis not present

## 2018-10-05 DIAGNOSIS — Z79899 Other long term (current) drug therapy: Secondary | ICD-10-CM | POA: Insufficient documentation

## 2018-10-05 DIAGNOSIS — K8050 Calculus of bile duct without cholangitis or cholecystitis without obstruction: Secondary | ICD-10-CM | POA: Diagnosis not present

## 2018-10-05 DIAGNOSIS — K838 Other specified diseases of biliary tract: Secondary | ICD-10-CM

## 2018-10-05 DIAGNOSIS — J45909 Unspecified asthma, uncomplicated: Secondary | ICD-10-CM | POA: Insufficient documentation

## 2018-10-05 DIAGNOSIS — Z4689 Encounter for fitting and adjustment of other specified devices: Secondary | ICD-10-CM

## 2018-10-05 DIAGNOSIS — K8000 Calculus of gallbladder with acute cholecystitis without obstruction: Secondary | ICD-10-CM

## 2018-10-05 HISTORY — PX: REMOVAL OF STONES: SHX5545

## 2018-10-05 HISTORY — DX: Other complications of anesthesia, initial encounter: T88.59XA

## 2018-10-05 HISTORY — DX: Major depressive disorder, single episode, unspecified: F32.9

## 2018-10-05 HISTORY — DX: Depression, unspecified: F32.A

## 2018-10-05 HISTORY — PX: STENT REMOVAL: SHX6421

## 2018-10-05 HISTORY — DX: Anemia, unspecified: D64.9

## 2018-10-05 HISTORY — PX: ERCP: SHX5425

## 2018-10-05 HISTORY — DX: Adverse effect of unspecified anesthetic, initial encounter: T41.45XA

## 2018-10-05 LAB — POCT PREGNANCY, URINE: Preg Test, Ur: NEGATIVE

## 2018-10-05 SURGERY — ERCP, WITH INTERVENTION IF INDICATED
Anesthesia: General

## 2018-10-05 MED ORDER — IOPAMIDOL (ISOVUE-300) INJECTION 61%
INTRAVENOUS | Status: AC
  Filled 2018-10-05: qty 100

## 2018-10-05 MED ORDER — GLUCAGON HCL RDNA (DIAGNOSTIC) 1 MG IJ SOLR
INTRAMUSCULAR | Status: AC
  Filled 2018-10-05: qty 1

## 2018-10-05 MED ORDER — INDOMETHACIN 50 MG RE SUPP
RECTAL | Status: AC
  Filled 2018-10-05: qty 2

## 2018-10-05 MED ORDER — ROCURONIUM BROMIDE 10 MG/ML (PF) SYRINGE
PREFILLED_SYRINGE | INTRAVENOUS | Status: DC | PRN
  Administered 2018-10-05: 50 mg via INTRAVENOUS

## 2018-10-05 MED ORDER — FENTANYL CITRATE (PF) 100 MCG/2ML IJ SOLN
INTRAMUSCULAR | Status: DC | PRN
  Administered 2018-10-05: 100 ug via INTRAVENOUS

## 2018-10-05 MED ORDER — ONDANSETRON HCL 4 MG/2ML IJ SOLN
INTRAMUSCULAR | Status: DC | PRN
  Administered 2018-10-05: 4 mg via INTRAVENOUS

## 2018-10-05 MED ORDER — DEXAMETHASONE SODIUM PHOSPHATE 10 MG/ML IJ SOLN
INTRAMUSCULAR | Status: DC | PRN
  Administered 2018-10-05: 10 mg via INTRAVENOUS

## 2018-10-05 MED ORDER — FENTANYL CITRATE (PF) 100 MCG/2ML IJ SOLN: 25.0000 ug | INTRAMUSCULAR | Status: DC | PRN

## 2018-10-05 MED ORDER — SODIUM CHLORIDE 0.9 % IV SOLN: INTRAVENOUS | Status: DC

## 2018-10-05 MED ORDER — LIDOCAINE HCL (CARDIAC) PF 100 MG/5ML IV SOSY
PREFILLED_SYRINGE | INTRAVENOUS | Status: DC | PRN
  Administered 2018-10-05: 100 mg via INTRAVENOUS

## 2018-10-05 MED ORDER — CIPROFLOXACIN IN D5W 400 MG/200ML IV SOLN
INTRAVENOUS | Status: AC
  Filled 2018-10-05: qty 200

## 2018-10-05 MED ORDER — ONDANSETRON HCL 4 MG PO TABS: 8.0000 mg | ORAL_TABLET | Freq: Three times a day (TID) | ORAL | 0 refills | Status: AC | PRN

## 2018-10-05 MED ORDER — GLUCAGON HCL RDNA (DIAGNOSTIC) 1 MG IJ SOLR
INTRAMUSCULAR | Status: DC | PRN
  Administered 2018-10-05: 0.25 mg via INTRAVENOUS

## 2018-10-05 MED ORDER — PROPOFOL 10 MG/ML IV BOLUS
INTRAVENOUS | Status: DC | PRN
  Administered 2018-10-05: 20 mg via INTRAVENOUS
  Administered 2018-10-05: 150 mg via INTRAVENOUS

## 2018-10-05 MED ORDER — CIPROFLOXACIN IN D5W 400 MG/200ML IV SOLN
400.0000 mg | Freq: Once | INTRAVENOUS | Status: AC
  Administered 2018-10-05: 400 mg via INTRAVENOUS

## 2018-10-05 MED ORDER — INDOMETHACIN 50 MG RE SUPP
RECTAL | Status: DC | PRN
  Administered 2018-10-05: 100 mg via RECTAL

## 2018-10-05 MED ORDER — MIDAZOLAM HCL 2 MG/2ML IJ SOLN
INTRAMUSCULAR | Status: DC | PRN
  Administered 2018-10-05: 2 mg via INTRAVENOUS

## 2018-10-05 MED ORDER — PROPOFOL 500 MG/50ML IV EMUL
INTRAVENOUS | Status: AC
  Filled 2018-10-05: qty 250

## 2018-10-05 MED ORDER — ONDANSETRON HCL 4 MG/2ML IJ SOLN
INTRAMUSCULAR | Status: AC
  Filled 2018-10-05: qty 2

## 2018-10-05 MED ORDER — IBUPROFEN 200 MG PO TABS: 200.0000 mg | ORAL_TABLET | Freq: Four times a day (QID) | ORAL | 0 refills | Status: DC | PRN

## 2018-10-05 MED ORDER — ONDANSETRON HCL 4 MG/2ML IJ SOLN
4.0000 mg | Freq: Once | INTRAMUSCULAR | Status: AC | PRN
  Administered 2018-10-05: 4 mg via INTRAVENOUS

## 2018-10-05 MED ORDER — SODIUM CHLORIDE 0.9 % IV SOLN
INTRAVENOUS | Status: DC | PRN
  Administered 2018-10-05: 35 mL

## 2018-10-05 MED ORDER — SUGAMMADEX SODIUM 200 MG/2ML IV SOLN
INTRAVENOUS | Status: DC | PRN
  Administered 2018-10-05: 200 mg via INTRAVENOUS

## 2018-10-05 MED ORDER — LACTATED RINGERS IV SOLN
INTRAVENOUS | Status: DC | PRN
  Administered 2018-10-05 (×2): via INTRAVENOUS

## 2018-10-05 NOTE — H&P (Signed)
GASTROENTEROLOGY OUTPATIENT PROCEDURE H&P NOTE   Primary Care Physician: System, Pcp Not In  HPI: Sarah Drake is a 33 y.o. female who presents for ERCP.  Past Medical History:  Diagnosis Date  . Anemia    low iron  . Asthma   . Depression    chronic depression   Past Surgical History:  Procedure Laterality Date  . BALLOON DILATION N/A 07/11/2018   Procedure: BALLOON DILATION;  Surgeon: Meridee Score Netty Starring., MD;  Location: [facility];  Service: Gastroenterology;  Laterality: N/A;  . BILIARY STENT PLACEMENT N/A 07/11/2018   Procedure: BILIARY STENT PLACEMENT;  Surgeon: Meridee Score Netty Starring., MD;  Location: [facility];  Service: Gastroenterology;  Laterality: N/A;  10x 7  . BIOPSY  07/11/2018   Procedure: BIOPSY;  Surgeon: Meridee Score Netty Starring., MD;  Location: [facility];  Service: Gastroenterology;;  . CESAREAN SECTION    . CHOLECYSTECTOMY N/A 07/10/2018   Procedure: LAPAROSCOPIC CHOLECYSTECTOMY WITH INTRAOPERATIVE CHOLANGIOGRAM, 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;  Surgeon: Ovidio Kin, MD;  Location: [facility];  Service: General;  Laterality: N/A;  . ERCP N/A 07/11/2018   Procedure: ENDOSCOPIC RETROGRADE CHOLANGIOPANCREATOGRAPHY (ERCP);  Surgeon: Lemar Lofty., MD;  Location: [facility];  Service: Gastroenterology;  Laterality: N/A;  . PANCREATIC STENT PLACEMENT  07/11/2018   Procedure: PANCREATIC STENT PLACEMENT;  Surgeon: Meridee Score Netty Starring., MD;  Location: [facility];  Service: Gastroenterology;;  4x7  . REMOVAL OF STONES  07/11/2018   Procedure: REMOVAL OF STONES;  Surgeon: Meridee Score Netty Starring., MD;  Location: [facility];  Service: Gastroenterology;;  . Sarah Drake  07/11/2018   Procedure: Sarah Drake;  Surgeon: Mansouraty, Netty Starring., MD;  Location: [facility];  Service: Gastroenterology;;  . TUBAL LIGATION     Current Facility-Administered Medications  Medication Dose Route Frequency Provider  Last Rate Last Dose  . 0.9 %  sodium chloride infusion   Intravenous Continuous Mansouraty, Netty Starring., MD      . ciprofloxacin (CIPRO) IVPB 400 mg  400 mg Intravenous Once Mansouraty, Netty Starring., MD      . fentaNYL (SUBLIMAZE) injection 25-50 mcg  25-50 mcg Intravenous Q5 min PRN Kipp Brood, MD      . ondansetron [facility]) injection 4 mg  4 mg Intravenous Once PRN Kipp Brood, MD       No Known Allergies History reviewed. No pertinent family history. Social History   Socioeconomic History  . Marital status: Single    Spouse name: Not on file  . Number of children: Not on file  . Years of education: Not on file  . Highest education level: Not on file  Occupational History  . Not on file  Social Needs  . Financial resource strain: Not on file  . Food insecurity:    Worry: Not on file    Inability: Not on file  . Transportation needs:    Medical: Not on file    Non-medical: Not on file  Tobacco Use  . Smoking status: Never Smoker  . Smokeless tobacco: Never Used  Substance and Sexual Activity  . Alcohol use: No  . Drug use: Yes    Types: Marijuana    Comment: daily  . Sexual activity: Not on file  Lifestyle  . Physical activity:    Days per week: Not on file    Minutes per session: Not on file  . Stress: Not on file  Relationships  . Social connections:    Talks on phone: Not on file  Gets together: Not on file    Attends religious service: Not on file    Active member of club or organization: Not on file    Attends meetings of clubs or organizations: Not on file    Relationship status: Not on file  . Intimate partner violence:    Fear of current or ex partner: Not on file    Emotionally abused: Not on file    Physically abused: Not on file    Forced sexual activity: Not on file  Other Topics Concern  . Not on file  Social History Narrative  . Not on file    Physical Exam: Vital signs in last 24 hours: Temp:  [98.5 F (36.9 C)] 98.5 F (36.9 C)  (02/19 0814) Pulse Rate:  [79] 79 (02/19 0814) Resp:  [17] 17 (02/19 0814) BP: (116)/(64) 116/64 (02/19 0814) SpO2:  [100 %] 100 % (02/19 0814) Weight:  [95.3 kg] 95.3 kg (02/19 0814)   GEN: NAD EYE: Sclerae anicteric ENT: MMM CV: RR without R/Gs  RESP: CTAB posteriorly GI: Soft, NT/ND NEURO:  Alert & Oriented x 3  Lab Results: No results for input(s): WBC, HGB, HCT, PLT in the last 72 hours. BMET No results for input(s): NA, K, CL, CO2, GLUCOSE, BUN, CREATININE, CALCIUM in the last 72 hours. LFT No results for input(s): PROT, ALBUMIN, AST, ALT, ALKPHOS, BILITOT, BILIDIR, IBILI in the last 72 hours. PT/INR No results for input(s): LABPROT, INR in the last 72 hours.   Impression / Plan: This is a 33 y.o.female who presents for ERCP.  The risks and benefits of endoscopic evaluation were discussed with the patient; these include but are not limited to the risk of perforation, infection, bleeding, missed lesions, lack of diagnosis, severe illness requiring hospitalization, as well as anesthesia and sedation related illnesses.  The patient is agreeable to proceed.   The risks of an ERCP were discussed at length, including but not limited to the risk of perforation, bleeding, abdominal pain, post-ERCP pancreatitis (while usually mild can be severe and even life threatening).   Sarah Parish, MD  [facility] Office # [phone]

## 2018-10-05 NOTE — Progress Notes (Signed)
Patient had a fixed gauze, her body was shaking, she was gagging, and was nonverbal. Asked patient about nausea and did not respond. Woodrum MD was paged, head of the bed was elevated, and respiratory sounds were reassessed. I assessed reflexes and orientation. Patient became responsive and Woodrum MD assessed patient. Zofran was ordered and given and patient vomited once. Zofran intervention was effective. Patient is more alert.

## 2018-10-05 NOTE — Telephone Encounter (Signed)
-----   Message from Lemar Lofty., MD sent at 10/05/2018 10:49 AM EST ----- Dr. Ezzard Standing, wanted to let you know that we did her repeat ERCP.  Able to remove the remaining piece of stone and remove some sludge.  No further bile leak.  Stent removed.  Discharged.  I think this chapter for her will be done. Datron Brakebill, please call patient this week and set up a follow up lab in approximately 2-weeks and follow up with myself or one of the Pas, to ensure all is well. Thanks. GM

## 2018-10-05 NOTE — Telephone Encounter (Signed)
Left message on machine to call back   Appt made with Sarah Drake on 10/19/18 at 830 am and labs in Epic for 2 weeks.

## 2018-10-05 NOTE — Discharge Instructions (Signed)
YOU HAD AN ENDOSCOPIC PROCEDURE TODAY: Refer to the procedure report and other information in the discharge instructions given to you for any specific questions about what was found during the examination. If this information does not answer your questions, please call [place] office at [phone] to clarify.   YOU SHOULD EXPECT: Some feelings of bloating in the abdomen. Passage of more gas than usual. Walking can help get rid of the air that was put into your GI tract during the procedure and reduce the bloating. If you had a lower endoscopy (such as a colonoscopy or flexible sigmoidoscopy) you may notice spotting of blood in your stool or on the toilet paper. Some abdominal soreness may be present for a day or two, also.  DIET: Your first meal following the procedure should be a light meal and then it is ok to progress to your normal diet. A half-sandwich or bowl of soup is an example of a good first meal. Heavy or fried foods are harder to digest and may make you feel nauseous or bloated. Drink plenty of fluids but you should avoid alcoholic beverages for 24 hours. If you had a esophageal dilation, please see attached instructions for diet.    ACTIVITY: Your care partner should take you home directly after the procedure. You should plan to take it easy, moving slowly for the rest of the day. You can resume normal activity the day after the procedure however YOU SHOULD NOT DRIVE, use power tools, machinery or perform tasks that involve climbing or major physical exertion for 24 hours (because of the sedation medicines used during the test).   SYMPTOMS TO REPORT IMMEDIATELY: A gastroenterologist can be reached at any hour. Please call [phone]  for any of the following symptoms:   Following upper endoscopy (EGD, EUS, ERCP, esophageal dilation) Vomiting of blood or coffee ground material  New, significant abdominal pain  New, significant chest pain or pain under the shoulder blades  Painful or  persistently difficult swallowing  New shortness of breath  Black, tarry-looking or red, bloody stools  FOLLOW UP:  If any biopsies were taken you will be contacted by phone or by letter within the next 1-3 weeks. Call [phone]  if you have not heard about the biopsies in 3 weeks.  Please also call with any specific questions about appointments or follow up tests.  

## 2018-10-05 NOTE — Anesthesia Procedure Notes (Signed)
Procedure Name: Intubation Date/Time: 10/05/2018 9:05 AM Performed by: Raenette Rover, CRNA Pre-anesthesia Checklist: Patient identified, Emergency Drugs available, Suction available and Patient being monitored Patient Re-evaluated:Patient Re-evaluated prior to induction Oxygen Delivery Method: Circle system utilized Preoxygenation: Pre-oxygenation with 100% oxygen Induction Type: IV induction Ventilation: Mask ventilation without difficulty Laryngoscope Size: Mac and 3 Grade View: Grade I Tube type: Oral Tube size: 7.0 mm Number of attempts: 1 Airway Equipment and Method: Stylet Placement Confirmation: ETT inserted through vocal cords under direct vision,  positive ETCO2,  CO2 detector and breath sounds checked- equal and bilateral Secured at: 21 cm Tube secured with: Tape Dental Injury: Teeth and Oropharynx as per pre-operative assessment

## 2018-10-05 NOTE — Op Note (Signed)
[facility] Patient Name: Sarah Drake Procedure Date : 10/05/2018 MRN: [id number] Attending MD: Justice Britain , MD Date of Birth: 19-Jun-1986 CSN: [id number] Age: 33 Admit Type: Outpatient Procedure:                ERCP Indications:              Bile duct stone(s), Bile leak, Biliary stent removal Providers:                Justice Britain, MD, Burtis Junes, RN, Charolette Child, Technician, Hedy Camara, CRNA Referring MD:             Alphonsa Overall, MD Medicines:                General Anesthesia, Cipro 400 mg IV, Indomethacin                            591 mg PR Complications:            No immediate complications. Estimated Blood Loss:     Estimated blood loss was minimal. Procedure:                Pre-Anesthesia Assessment:                           - Prior to the procedure, a History and Physical                            was performed, and patient medications and                            allergies were reviewed. The patient's tolerance of                            previous anesthesia was also reviewed. The risks                            and benefits of the procedure and the sedation                            options and risks were discussed with the patient.                            All questions were answered, and informed consent                            was obtained. Prior Anticoagulants: The patient has                            taken previous NSAID medication. ASA Grade                            Assessment: II - A patient with mild systemic  disease. After reviewing the risks and benefits,                            the patient was deemed in satisfactory condition to                            undergo the procedure.                           After obtaining informed consent, the scope was                            passed under direct vision. Throughout the   procedure, the patient's blood pressure, pulse, and                            oxygen saturations were monitored continuously. The                            TJF-Q180V ([id number]) Olympus Duodensocope was                            introduced through the mouth, and used to inject                            contrast into and used to inject contrast into the                            bile duct. The ERCP was accomplished without                            difficulty. The patient tolerated the procedure. Scope In: Scope Out: Findings:      A biliary stent was visible on the scout film.      The upper GI tract was traversed under direct vision without detailed       examination. No gross lesions were noted in the entire examined stomach       - previous erythema has healed. A biliary sphincterotomy had been       performed. The sphincterotomy appeared open. One plastic biliary stent       originating in the biliary tree was emerging from the major papilla. The       stent was visibly patent. The stent was removed from the biliary tree       using a snare.      A short 0.035 inch Soft Jagwire was passed into the biliary tree. The       short-nosed traction sphincterotome was passed over the guidewire and       the bile duct was then deeply cannulated. Contrast was injected. I       personally interpreted the bile duct images. Ductal flow of contrast was       adequate. Image quality was adequate. Contrast extended to the hepatic       ducts. Opacification of the entire biliary tree except for the       gallbladder was successful. The maximum diameter of the ducts was 8 mm.  The lower third of the main duct contained a filling defect thought to       be a stone. The biliary pancreatic junction and the lower third of the       main bile duct were successfully dilated with a 6-7-8 mm balloon (to a       maximum balloon size of 8 mm) dilator as a sphincteroplasty for       4-minutes. The  biliary tree was then swept with a retrieval balloon       starting at the bifurcation. Sludge was swept from the duct. One stone       was removed. No stones remained. An occlusion cholangiogram was       performed that showed no further significant biliary pathology. There       was no extravasation of contrast.      A pancreatogram was not performed.      The duodenoscope was withdrawn from the patient. Impression:               - No gross lesions in the stomach.                           - Prior biliary sphincterotomy appeared open.                           - One visibly patent stent from the biliary tree                            was seen in the major papilla. This was removed.                           - A filling defect consistent with a stone was seen                            on the cholangiogram.                           - Choledocholithiasis was found. Complete removal                            was accomplished by sphincteroplasty and balloon                            sweeping. Recommendation:           - The patient will be observed post-procedure,                            until all discharge criteria are met.                           - Discharge patient to home.                           - Patient has a contact number available for                            emergencies. The signs and symptoms of  potential                            delayed complications were discussed with the                            patient. Return to normal activities tomorrow.                            Written discharge instructions were provided to the                            patient.                           - Resume regular diet.                           - Check liver enzymes (AST, ALT, alkaline                            phosphatase, bilirubin) in 2 weeks.                           - Observe patient's clinical course.                           - Watch for pancreatitis, bleeding,  perforation,                            and cholangitis.                           - Minimize NSAID use for next 1-week to decrease                            risk of post-sphincteroplasty bleeding.                           - The findings and recommendations were discussed                            with the patient.                           - The findings and recommendations were discussed                            with the patient's family. Procedure Code(s):        --- Professional ---                           [phone], 25, Endoscopic retrograde                            cholangiopancreatography (ERCP); with  trans-endoscopic balloon dilation of                            biliary/pancreatic duct(s) or of ampulla                            (sphincteroplasty), including sphincterotomy, when                            performed, each duct                           43275, Endoscopic retrograde                            cholangiopancreatography (ERCP); with removal of                            foreign body(s) or stent(s) from biliary/pancreatic                            duct(s)                           43264, Endoscopic retrograde                            cholangiopancreatography (ERCP); with removal of                            calculi/debris from biliary/pancreatic duct(s) Diagnosis Code(s):        --- Professional ---                           O96.29, Presence of other specified functional                            implants                           K80.50, Calculus of bile duct without cholangitis                            or cholecystitis without obstruction                           Z46.59, Encounter for fitting and adjustment of                            other gastrointestinal appliance and device                           K83.8, Other specified diseases of biliary tract                           R93.2, Abnormal findings on diagnostic imaging  of  liver and biliary tract CPT copyright 2018 American Medical Association. All rights reserved. The codes documented in this report are preliminary and upon coder review may  be revised to meet current compliance requirements. Justice Britain, MD 10/05/2018 10:05:48 AM Number of Addenda: 0

## 2018-10-05 NOTE — Telephone Encounter (Signed)
Sorry Environmental manager. Thanks for checking. HFP.

## 2018-10-05 NOTE — Progress Notes (Signed)
Saw patient in recovery on multiple occasions.  Initially she was sleeping.  Then she awoke and was complaining of persistent nausea.  Ended up having a bowel movement and received Zofran feeling better currently. No evidence of any abdominal pain or discomfort. I think post ERCP pancreatitis is much less likely. She recalls that after her last ERCP that she had a scopolamine patch that may have mitigated some issues with nausea that she had after the procedure but recalls having nausea as well. The patient and mother feel comfortable with going home and monitoring closely with eating slowly.  We will plan on sending her home with Zofran 8 mg every 8 hour as needed for the next few days.  I will have our team reach out to her tomorrow to ensure she is doing well. Patient and mother understand that if symptoms progress or she is not able to tolerate any oral intake today and tomorrow that she may require observation to ensure she does not have pancreatitis and to ensure she stays hydrated. They are appreciative for the care they have received and are in agreement with this plan.  Corliss Parish, MD [facility] Office # [phone]

## 2018-10-05 NOTE — Anesthesia Postprocedure Evaluation (Signed)
Anesthesia Post Note  Patient: Sarah Drake  Procedure(s) Performed: ENDOSCOPIC RETROGRADE CHOLANGIOPANCREATOGRAPHY (ERCP) (N/A ) BILIARY DILATION REMOVAL OF STONES STENT REMOVAL     Patient location during evaluation: PACU Anesthesia Type: General Level of consciousness: awake Pain management: pain level controlled Vital Signs Assessment: post-procedure vital signs reviewed and stable Respiratory status: spontaneous breathing Cardiovascular status: stable Postop Assessment: no apparent nausea or vomiting Anesthetic complications: no    Last Vitals:  Vitals:   10/05/18 1100 10/05/18 1118  BP: 131/66 117/82  Pulse: 77 68  Resp: 20 18  Temp:    SpO2: 99% 100%    Last Pain:  Vitals:   10/05/18 1118  TempSrc:   PainSc: 0-No pain                 Redith Drach

## 2018-10-05 NOTE — Telephone Encounter (Signed)
Dr Meridee Score what labs would you like the pt to have?

## 2018-10-05 NOTE — Transfer of Care (Signed)
Immediate Anesthesia Transfer of Care Note  Patient: Nurah Boeger  Procedure(s) Performed: ENDOSCOPIC RETROGRADE CHOLANGIOPANCREATOGRAPHY (ERCP) (N/A ) BILIARY DILATION REMOVAL OF STONES STENT REMOVAL  Patient Location: PACU  Anesthesia Type:General  Level of Consciousness: awake and patient cooperative  Airway & Oxygen Therapy: Patient Spontanous Breathing and Patient connected to nasal cannula oxygen  Post-op Assessment: Report given to RN and Post -op Vital signs reviewed and stable  Post vital signs: Reviewed and stable  Last Vitals:  Vitals Value Taken Time  BP 136/82 10/05/2018 10:07 AM  Temp    Pulse 87 10/05/2018 10:08 AM  Resp 21 10/05/2018 10:08 AM  SpO2 99 % 10/05/2018 10:08 AM  Vitals shown include unvalidated device data.  Last Pain:  Vitals:   10/05/18 0814  TempSrc: Oral  PainSc: 0-No pain         Complications: No apparent anesthesia complications

## 2018-10-06 NOTE — Telephone Encounter (Signed)
The patient has been notified of this information and all questions answered.

## 2018-10-14 ENCOUNTER — Telehealth: Payer: Self-pay

## 2018-10-14 MED ORDER — ALBUTEROL SULFATE HFA 108 (90 BASE) MCG/ACT IN AERS: 2.0000 | INHALATION_SPRAY | RESPIRATORY_TRACT | 0 refills | Status: DC | PRN

## 2018-10-14 NOTE — Telephone Encounter (Signed)
Pt. Calling needing a refill on her proair hfa. I did schedule her an appointment with Thermon Leyland 10/24/2018 at 2:30. Told pt. Will fill this one time only. Pt aware. Pt. Stated she didn't get the proair hfa filled bc she didn't have the 90.00 at the time.

## 2018-10-19 ENCOUNTER — Ambulatory Visit: Payer: BLUE CROSS/BLUE SHIELD | Admitting: Physician Assistant

## 2018-10-24 ENCOUNTER — Encounter: Payer: Self-pay | Admitting: Family Medicine

## 2018-10-24 ENCOUNTER — Ambulatory Visit (INDEPENDENT_AMBULATORY_CARE_PROVIDER_SITE_OTHER): Payer: BLUE CROSS/BLUE SHIELD | Admitting: Family Medicine

## 2018-10-24 VITALS — BP 118/66 | HR 92 | Temp 98.4°F | Resp 18 | Ht 65.0 in | Wt 232.0 lb

## 2018-10-24 DIAGNOSIS — J302 Other seasonal allergic rhinitis: Secondary | ICD-10-CM | POA: Diagnosis not present

## 2018-10-24 DIAGNOSIS — H1010 Acute atopic conjunctivitis, unspecified eye: Secondary | ICD-10-CM | POA: Insufficient documentation

## 2018-10-24 DIAGNOSIS — J3089 Other allergic rhinitis: Secondary | ICD-10-CM | POA: Diagnosis not present

## 2018-10-24 DIAGNOSIS — J4540 Moderate persistent asthma, uncomplicated: Secondary | ICD-10-CM | POA: Diagnosis not present

## 2018-10-24 MED ORDER — OLOPATADINE HCL 0.2 % OP SOLN: OPHTHALMIC | 5 refills | Status: DC

## 2018-10-24 MED ORDER — ALBUTEROL SULFATE HFA 108 (90 BASE) MCG/ACT IN AERS: 2.0000 | INHALATION_SPRAY | RESPIRATORY_TRACT | 3 refills | Status: DC | PRN

## 2018-10-24 MED ORDER — FLUTICASONE PROPIONATE HFA 110 MCG/ACT IN AERO: INHALATION_SPRAY | RESPIRATORY_TRACT | 5 refills | Status: DC

## 2018-10-24 MED ORDER — MONTELUKAST SODIUM 10 MG PO TABS: ORAL_TABLET | ORAL | 5 refills | Status: DC

## 2018-10-24 NOTE — Progress Notes (Signed)
[address] [place] [address] Dept: [phone]  FOLLOW UP NOTE  Patient ID: Sarah Drake, female    DOB: September 20, 1985  Age: 33 y.o. MRN: [id number] Date of Office Visit: 10/24/2018  Assessment  Chief Complaint: Asthma (doing well.) and Allergic Rhinitis   HPI Sarah Drake is a 32 year old female who presents to the clinic for a follow up visit. She reports that her asthma has not been well controlled with shortness of breath while at work and with exercise and a dry cough which is worse during the day. She is currently using ProAir 2-3 times a day while at work. She is not taking montelukast or using an asthma maintenance inhaler. She reports allergic rhinitis as not well controlled with nasal congestion and sneezing for which she is not currently using any medical intervention. Her current medications are listed in the chart.   Drug Allergies:  No Known Allergies  Physical Exam: BP 118/66 (BP Location: Right Arm, Patient Position: Sitting, Cuff Size: Normal)   Pulse 92   Temp 98.4 F (36.9 C) (Oral)   Resp 18   Ht 5\' 5"  (1.651 m)   Wt 232 lb (105.2 kg)   SpO2 99%   BMI 38.61 kg/m    Physical Exam Vitals signs and nursing note reviewed.  Constitutional:      Appearance: Normal appearance.  HENT:     Head: Normocephalic and atraumatic.     Right Ear: Tympanic membrane normal.     Left Ear: Tympanic membrane normal.     Nose:     Comments: Bilateral nares edematous and pale with clear nasal drainage noted. Pharynx normal. Ears normal. Eyes normal.    Mouth/Throat:     Pharynx: Oropharynx is clear.  Eyes:     Conjunctiva/sclera: Conjunctivae normal.  Neck:     Musculoskeletal: Normal range of motion and neck supple.  Cardiovascular:     Rate and Rhythm: Normal rate and regular rhythm.     Heart sounds: Normal heart sounds. No murmur.  Pulmonary:     Effort: Pulmonary effort is normal.     Breath sounds: Normal breath sounds.   Comments: Lungs clear to auscultation Musculoskeletal: Normal range of motion.  Skin:    General: Skin is warm and dry.  Neurological:     Mental Status: She is alert and oriented to person, place, and time.  Psychiatric:        Mood and Affect: Mood normal.        Behavior: Behavior normal.        Thought Content: Thought content normal.        Judgment: Judgment normal.     Diagnostics: FVC 3.63, FEV1 2.80. Predicted FVC 3.30, predicted FEV1 2.79. Spirometry is within the normal range.   Assessment and Plan: 1. Moderate persistent asthma without complication   2. Seasonal and perennial allergic rhinitis   3. Seasonal allergic conjunctivitis     Meds ordered this encounter  Medications  . montelukast (SINGULAIR) 10 MG tablet    Sig: Take 1 tablet once a day at bedtime to prevent shortness of breath, coughing and wheeze    Dispense:  30 tablet    Refill:  5  . fluticasone (FLOVENT HFA) 110 MCG/ACT inhaler    Sig: 2 puffs once a day with a spacer to prevent coughing and wheeze rinse and spit after use    Dispense:  1 Inhaler    Refill:  5  . albuterol (PROAIR  HFA) 108 (90 Base) MCG/ACT inhaler    Sig: Inhale 2 puffs into the lungs every 4 (four) hours as needed for wheezing or shortness of breath.    Dispense:  1 Inhaler    Refill:  3  . Olopatadine HCl 0.2 % SOLN    Sig: 1 drop in each eye as needed for red itchy eyes    Dispense:  2.5 mL    Refill:  5    Patient Instructions  Asthma Begin montelukast 10 mg once a day at bedtime to prevent shortness of breath, cough, and wheeze Begin Flovent 110- 2 puffs once a day with a spacer to prevent cough and wheeze. Rinse and spit after each use. ProAir 2 puffs every 4 hours as needed for cough and wheeze Use ProAir 2 puffs about 5-15 minutes before exercise to decrease cough and wheeze with activity   Allergic rhinitis/allergic conjunctivitis Begin Zyrtec 10 mg once a day as needed for runny nose or sneezing Consider  nasal saline rinses before any medicated nasal sprays Flonase nasal spray 2 sprays per nostril once a day as needed for a runny nose Begin olopatadine 0.2% eye drops- one drop in each eye as needed for red, itchy eyes  Call me if this treatment plan is not working for you  Follow up in 3 months or sooner if needed   Return in about 3 months (around 01/24/2019), or if symptoms worsen or fail to improve.    Thank you for the opportunity to care for this patient.  Please do not hesitate to contact me with questions.  Thermon Leyland, FNP [facility]  I have provided oversight concerning Thermon Leyland' evaluation and treatment of this patient's health issues addressed during today's encounter. I agree with the assessment and therapeutic plan as outlined in the note.    Thank you for the opportunity to care for this patient.  Please do not hesitate to contact me with questions.  Tonette Bihari, M.D.  [facility] [address] [place], [place] [address] [phone]

## 2018-10-24 NOTE — Patient Instructions (Addendum)
Asthma Begin montelukast 10 mg once a day at bedtime to prevent shortness of breath, cough, and wheeze Begin Flovent 110- 2 puffs once a day with a spacer to prevent cough and wheeze. Rinse and spit after each use. ProAir 2 puffs every 4 hours as needed for cough and wheeze Use ProAir 2 puffs about 5-15 minutes before exercise to decrease cough and wheeze with activity   Allergic rhinitis/allergic conjunctivitis Begin Zyrtec 10 mg once a day as needed for runny nose or sneezing Consider nasal saline rinses before any medicated nasal sprays Flonase nasal spray 2 sprays per nostril once a day as needed for a runny nose Begin olopatadine 0.2% eye drops- one drop in each eye as needed for red, itchy eyes  Call me if this treatment plan is not working for you  Follow up in 3 months or sooner if needed

## 2018-10-28 ENCOUNTER — Ambulatory Visit: Payer: BLUE CROSS/BLUE SHIELD | Admitting: Physician Assistant

## 2018-11-04 ENCOUNTER — Telehealth: Payer: Self-pay

## 2018-11-04 NOTE — Telephone Encounter (Signed)
Covid-19 travel screening questions  Have you traveled in the last 14 days? NO If yes where?  Do you now or have you had a fever in the last 14 days? NO  Do you have any respiratory symptoms of shortness of breath or cough now or in the last 14 days? NO   Do you have any family members or close contacts with diagnosed or suspected Covid-19? NO       

## 2018-11-07 ENCOUNTER — Other Ambulatory Visit: Payer: Self-pay

## 2018-11-07 ENCOUNTER — Telehealth (INDEPENDENT_AMBULATORY_CARE_PROVIDER_SITE_OTHER): Payer: BLUE CROSS/BLUE SHIELD | Admitting: Physician Assistant

## 2018-11-07 DIAGNOSIS — K838 Other specified diseases of biliary tract: Secondary | ICD-10-CM

## 2018-11-07 NOTE — Progress Notes (Signed)
Reviewed and agree with management plan. Elevated LFTs. Pt advised to obtain LFTs this week. Elevated LFTs are likely biliary and expect they should be normalized.  Venita Lick. Russella Dar, MD [facility]

## 2018-11-07 NOTE — Patient Instructions (Signed)
Please come to the lab in the basement of our building anytime between 7:30 am-5:00 pm over the next week to have repeat LFTs drawn.  These have already been ordered for you.  Thank you for allowing me to assist with your care today, Hyacinth Meeker, PA-C

## 2018-11-07 NOTE — Progress Notes (Signed)
TELEMEDICINE VISIT  Chief Complaint: Follow-up after bile duct leak  HPI:    Sarah Drake is a 33 year old female with a past medical history as listed below, known to Dr. Russella Dar from recent hospitalization for bile duct leak.  We are following up on that today.    07/10/2018 patient initially seen by our service for choledocholithiasis.  She had an ERCP with Dr. Meridee Score, stent was placed.    10/05/2018 repeat ERCP with removal of stent.  Patient was somewhat nauseous after procedure.    Today, the patient tells me that she has no complaints or concerns.  She is feeling completely well and has no questions.    Denies fever, chills, abdominal pain, nausea or vomiting.  Past Medical History:  Diagnosis Date  . Anemia    low iron  . Asthma   . Complication of anesthesia    post op nausea and vomiting  . Depression    chronic depression    Past Surgical History:  Procedure Laterality Date  . BALLOON DILATION N/A 07/11/2018   Procedure: BALLOON DILATION;  Surgeon: Meridee Score Netty Starring., MD;  Location: [facility];  Service: Gastroenterology;  Laterality: N/A;  . BILIARY STENT PLACEMENT N/A 07/11/2018   Procedure: BILIARY STENT PLACEMENT;  Surgeon: Meridee Score Netty Starring., MD;  Location: [facility] ENDOSCOPY;  Service: Gastroenterology;  Laterality: N/A;  10x 7  . BIOPSY  07/11/2018   Procedure: BIOPSY;  Surgeon: Meridee Score Netty Starring., MD;  Location: [facility];  Service: Gastroenterology;;  . CESAREAN SECTION    . CHOLECYSTECTOMY N/A 07/10/2018   Procedure: LAPAROSCOPIC CHOLECYSTECTOMY WITH INTRAOPERATIVE CHOLANGIOGRAM, 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;  Surgeon: Ovidio Kin, MD;  Location: [facility];  Service: General;  Laterality: N/A;  . CHOLECYSTECTOMY    . ERCP N/A 07/11/2018   Procedure: ENDOSCOPIC RETROGRADE CHOLANGIOPANCREATOGRAPHY (ERCP);  Surgeon: Lemar Lofty., MD;  Location: [facility];  Service: Gastroenterology;  Laterality: N/A;  .  ERCP N/A 10/05/2018   Procedure: ENDOSCOPIC RETROGRADE CHOLANGIOPANCREATOGRAPHY (ERCP);  Surgeon: Lemar Lofty., MD;  Location: [facility] ENDOSCOPY;  Service: Gastroenterology;  Laterality: N/A;  . PANCREATIC STENT PLACEMENT  07/11/2018   Procedure: PANCREATIC STENT PLACEMENT;  Surgeon: Meridee Score Netty Starring., MD;  Location: [facility] ENDOSCOPY;  Service: Gastroenterology;;  4x7  . REMOVAL OF STONES  07/11/2018   Procedure: REMOVAL OF STONES;  Surgeon: Meridee Score Netty Starring., MD;  Location: [facility];  Service: Gastroenterology;;  . REMOVAL OF STONES  10/05/2018   Procedure: REMOVAL OF STONES;  Surgeon: Lemar Lofty., MD;  Location: [facility] ENDOSCOPY;  Service: Gastroenterology;;  . Dennison Mascot  07/11/2018   Procedure: Dennison Mascot;  Surgeon: Lemar Lofty., MD;  Location: [facility];  Service: Gastroenterology;;  . Francine Graven REMOVAL  10/05/2018   Procedure: STENT REMOVAL;  Surgeon: Lemar Lofty., MD;  Location: [facility] ENDOSCOPY;  Service: Gastroenterology;;  . TUBAL LIGATION      Current Outpatient Medications  Medication Sig Dispense Refill  . albuterol (PROAIR HFA) 108 (90 Base) MCG/ACT inhaler Inhale 2 puffs into the lungs every 4 (four) hours as needed for wheezing or shortness of breath. 1 Inhaler 0  . albuterol (PROAIR HFA) 108 (90 Base) MCG/ACT inhaler Inhale 2 puffs into the lungs every 4 (four) hours as needed for wheezing or shortness of breath. 1 Inhaler 3  . fluticasone (FLOVENT HFA) 110 MCG/ACT inhaler 2 puffs once a day with a spacer to prevent coughing and wheeze rinse and spit after use 1 Inhaler 5  . ibuprofen (ADVIL,MOTRIN) 200  MG tablet Take 1 tablet (200 mg total) by mouth every 6 (six) hours as needed for headache or moderate pain. 30 tablet 0  . montelukast (SINGULAIR) 10 MG tablet Take 1 tablet once a day at bedtime to prevent shortness of breath, coughing and wheeze 30 tablet 5  . Olopatadine HCl 0.2 % SOLN 1 drop in each eye as needed for red itchy  eyes 2.5 mL 5   No current facility-administered medications for this visit.     Allergies as of 11/07/2018  . (No Known Allergies)    Family History  Problem Relation Age of Onset  . Allergic rhinitis Mother   . Angioedema Neg Hx   . Asthma Neg Hx   . Eczema Neg Hx   . Immunodeficiency Neg Hx   . Urticaria Neg Hx     Social History   Socioeconomic History  . Marital status: Single    Spouse name: Not on file  . Number of children: Not on file  . Years of education: Not on file  . Highest education level: Not on file  Occupational History  . Not on file  Social Needs  . Financial resource strain: Not on file  . Food insecurity:    Worry: Not on file    Inability: Not on file  . Transportation needs:    Medical: Not on file    Non-medical: Not on file  Tobacco Use  . Smoking status: Never Smoker  . Smokeless tobacco: Never Used  Substance and Sexual Activity  . Alcohol use: No  . Drug use: Yes    Types: Marijuana    Comment: daily  . Sexual activity: Not on file  Lifestyle  . Physical activity:    Days per week: Not on file    Minutes per session: Not on file  . Stress: Not on file  Relationships  . Social connections:    Talks on phone: Not on file    Gets together: Not on file    Attends religious service: Not on file    Active member of club or organization: Not on file    Attends meetings of clubs or organizations: Not on file    Relationship status: Not on file  . Intimate partner violence:    Fear of current or ex partner: Not on file    Emotionally abused: Not on file    Physically abused: Not on file    Forced sexual activity: Not on file  Other Topics Concern  . Not on file  Social History Narrative  . Not on file    Review of Systems:    Constitutional: No weight loss, fever or chills Cardiovascular: No chest pain Respiratory: No SOB  Gastrointestinal: See HPI and otherwise negative   Physical Exam: TELEMEDICINE VISIT- NO PHYSICAL  EXAM   No recent labs or imaging.  Assessment: 1.  History of a bile duct leak: Repeat ERCP 10/05/2018 with removal of stent, no complaints at this time  Plan: 1.  Discussed with patient that she has an order for repeat LFTs.  Explained that our lab is open from 730 to 5 PM.  She can come in and have those drawn anytime within the next week.  Would like to ensure that things are all back to normal.  She verbalized understanding. 2.  Patient to follow in clinic as needed or as recommended after labs above.  These services were provided via telemedicine.  The patient was at work and the provider  was in the office.  Patient did consent for this telephone visit and is aware of possible charges to her insurance.  There were no other persons participating in the telemedicine service.  Time spent on call: 4 minutes.  Hyacinth Meeker, PA-C [facility] 11/07/2018, 3:23 PM

## 2018-11-09 NOTE — Progress Notes (Signed)
Addendum: Previous encounter marked 4 minutes was erroneous. This was a 5 min encounter.  Hyacinth Meeker, PA-C

## 2018-11-11 ENCOUNTER — Other Ambulatory Visit (INDEPENDENT_AMBULATORY_CARE_PROVIDER_SITE_OTHER): Payer: BLUE CROSS/BLUE SHIELD

## 2018-11-11 DIAGNOSIS — K8000 Calculus of gallbladder with acute cholecystitis without obstruction: Secondary | ICD-10-CM | POA: Diagnosis not present

## 2018-11-11 DIAGNOSIS — K838 Other specified diseases of biliary tract: Secondary | ICD-10-CM

## 2018-11-11 LAB — HEPATIC FUNCTION PANEL
ALT: 10 U/L (ref 0–35)
AST: 11 U/L (ref 0–37)
Albumin: 4.1 g/dL (ref 3.5–5.2)
Alkaline Phosphatase: 40 U/L (ref 39–117)
Bilirubin, Direct: 0.1 mg/dL (ref 0.0–0.3)
Total Bilirubin: 0.4 mg/dL (ref 0.2–1.2)
Total Protein: 7.4 g/dL (ref 6.0–8.3)

## 2018-11-21 ENCOUNTER — Telehealth: Payer: Self-pay

## 2018-11-21 ENCOUNTER — Other Ambulatory Visit: Payer: Self-pay

## 2018-11-21 MED ORDER — ALBUTEROL SULFATE (2.5 MG/3ML) 0.083% IN NEBU: 2.5000 mg | INHALATION_SOLUTION | Freq: Four times a day (QID) | RESPIRATORY_TRACT | 1 refills | Status: DC | PRN

## 2018-11-21 NOTE — Telephone Encounter (Signed)
Pt called in and stated the pharmacy told her because of a national back order she can not get her proair refilled. I stated we was aware of this and only option is to see about albuterol nebulizer Pt does not have a nebulizer and being outside all day with her kids she is having to use the proair more. Can pt be given a nebulizer and [place] send in the albuterol.?   pts cell [phone]

## 2018-11-21 NOTE — Telephone Encounter (Signed)
She can have a nebulizer and albuterol 0.083% vials for her own personal use. Please remind her to take her montelukast and Flovent as well. Thank you

## 2018-11-21 NOTE — Telephone Encounter (Signed)
Informed pt to come by office and pick up nebulizer and I also sent in albuterol to her pharmacy  She is still taking all meds

## 2019-06-08 ENCOUNTER — Emergency Department (HOSPITAL_BASED_OUTPATIENT_CLINIC_OR_DEPARTMENT_OTHER)
Admission: EM | Admit: 2019-06-08 | Discharge: 2019-06-08 | Disposition: A | Discharge status: Home / Self Care | Payer: BC Managed Care – PPO | Attending: Emergency Medicine | Admitting: Emergency Medicine

## 2019-06-08 ENCOUNTER — Other Ambulatory Visit: Payer: Self-pay

## 2019-06-08 DIAGNOSIS — R197 Diarrhea, unspecified: Secondary | ICD-10-CM | POA: Diagnosis not present

## 2019-06-08 DIAGNOSIS — Z79899 Other long term (current) drug therapy: Secondary | ICD-10-CM | POA: Diagnosis not present

## 2019-06-08 DIAGNOSIS — J029 Acute pharyngitis, unspecified: Secondary | ICD-10-CM | POA: Insufficient documentation

## 2019-06-08 DIAGNOSIS — Z20828 Contact with and (suspected) exposure to other viral communicable diseases: Secondary | ICD-10-CM | POA: Insufficient documentation

## 2019-06-08 DIAGNOSIS — M7918 Myalgia, other site: Secondary | ICD-10-CM | POA: Diagnosis not present

## 2019-06-08 DIAGNOSIS — R52 Pain, unspecified: Secondary | ICD-10-CM

## 2019-06-08 DIAGNOSIS — J45909 Unspecified asthma, uncomplicated: Secondary | ICD-10-CM | POA: Diagnosis not present

## 2019-06-08 DIAGNOSIS — R05 Cough: Secondary | ICD-10-CM | POA: Diagnosis present

## 2019-06-08 DIAGNOSIS — Z20822 Contact with and (suspected) exposure to covid-19: Secondary | ICD-10-CM

## 2019-06-08 NOTE — ED Provider Notes (Signed)
[facility] [place] EMERGENCY DEPARTMENT Provider Note   CSN: [id number] Arrival date & time: 06/08/19  0957     History   Chief Complaint Chief Complaint  Patient presents with  . Cough    HPI Sarah Drake is a 33 y.o. female.     33yo F w/ PMH below who p/w sore throat and body aches. This morning she woke up with sore throat associated w/ body aches and not feeling herself. She denies any cough, runny nose, fevers, or vomiting. She has had diarrhea. She reports multiple people at work have had COVID-19 infection. She denies sick contacts at home. No medications PTA.  The history is provided by the patient.  Cough   Past Medical History:  Diagnosis Date  . Anemia    low iron  . Asthma   . Complication of anesthesia    post op nausea and vomiting  . Depression    chronic depression    Patient Active Problem List   Diagnosis Date Noted  . Seasonal allergic conjunctivitis 10/24/2018  . Cholecystitis with cholelithiasis 07/09/2018  . Seasonal and perennial allergic rhinitis 11/01/2017  . Moderate persistent asthma without complication 05/31/2015  . Allergic rhinitis 05/31/2015    Past Surgical History:  Procedure Laterality Date  . BALLOON DILATION N/A 07/11/2018   Procedure: BALLOON DILATION;  Surgeon: Meridee Score Netty Starring., MD;  Location: [facility];  Service: Gastroenterology;  Laterality: N/A;  . BILIARY STENT PLACEMENT N/A 07/11/2018   Procedure: BILIARY STENT PLACEMENT;  Surgeon: Meridee Score Netty Starring., MD;  Location: [facility]COPY;  Service: Gastroenterology;  Laterality: N/A;  10x 7  . BIOPSY  07/11/2018   Procedure: BIOPSY;  Surgeon: Meridee Score Netty Starring., MD;  Location: [facility];  Service: Gastroenterology;;  . CESAREAN SECTION    . CHOLECYSTECTOMY N/A 07/10/2018   Procedure: LAPAROSCOPIC CHOLECYSTECTOMY WITH INTRAOPERATIVE CHOLANGIOGRAM, 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;  Surgeon: Ovidio Kin, MD;  Location:  [facility] ORS;  Service: General;  Laterality: N/A;  . CHOLECYSTECTOMY    . ERCP N/A 07/11/2018   Procedure: ENDOSCOPIC RETROGRADE CHOLANGIOPANCREATOGRAPHY (ERCP);  Surgeon: Lemar Lofty., MD;  Location: [facility];  Service: Gastroenterology;  Laterality: N/A;  . ERCP N/A 10/05/2018   Procedure: ENDOSCOPIC RETROGRADE CHOLANGIOPANCREATOGRAPHY (ERCP);  Surgeon: Lemar Lofty., MD;  Location: [facility] ENDOSCOPY;  Service: Gastroenterology;  Laterality: N/A;  . PANCREATIC STENT PLACEMENT  07/11/2018   Procedure: PANCREATIC STENT PLACEMENT;  Surgeon: Meridee Score Netty Starring., MD;  Location: [facility] ENDOSCOPY;  Service: Gastroenterology;;  4x7  . REMOVAL OF STONES  07/11/2018   Procedure: REMOVAL OF STONES;  Surgeon: Meridee Score Netty Starring., MD;  Location: [facility];  Service: Gastroenterology;;  . REMOVAL OF STONES  10/05/2018   Procedure: REMOVAL OF STONES;  Surgeon: Lemar Lofty., MD;  Location: [facility] ENDOSCOPY;  Service: Gastroenterology;;  . Dennison Mascot  07/11/2018   Procedure: Dennison Mascot;  Surgeon: Lemar Lofty., MD;  Location: [facility];  Service: Gastroenterology;;  . Francine Graven REMOVAL  10/05/2018   Procedure: STENT REMOVAL;  Surgeon: Lemar Lofty., MD;  Location: [facility] ENDOSCOPY;  Service: Gastroenterology;;  . TUBAL LIGATION       OB History    Gravida  1   Para      Term      Preterm      AB      Living        SAB      TAB      Ectopic      Multiple  Live Births               Home Medications    Prior to Admission medications   Medication Sig Start Date End Date Taking? Authorizing Provider  albuterol (PROAIR HFA) 108 (90 Base) MCG/ACT inhaler Inhale 2 puffs into the lungs every 4 (four) hours as needed for wheezing or shortness of breath. 10/14/18   Hetty BlendAmbs, Anne M, FNP  albuterol (PROAIR HFA) 108 (90 Base) MCG/ACT inhaler Inhale 2 puffs into the lungs every 4 (four) hours as needed for wheezing or shortness of breath.  10/24/18   Hetty BlendAmbs, Anne M, FNP  albuterol (PROVENTIL) (2.5 MG/3ML) 0.083% nebulizer solution Take 3 mLs (2.5 mg total) by nebulization every 6 (six) hours as needed for wheezing or shortness of breath. 11/21/18   Hetty BlendAmbs, Anne M, FNP  fluticasone (FLOVENT HFA) 110 MCG/ACT inhaler 2 puffs once a day with a spacer to prevent coughing and wheeze rinse and spit after use 10/24/18   Ambs, Norvel RichardsAnne M, FNP  ibuprofen (ADVIL,MOTRIN) 200 MG tablet Take 1 tablet (200 mg total) by mouth every 6 (six) hours as needed for headache or moderate pain. 10/12/18   Mansouraty, Netty StarringGabriel Jr., MD  montelukast (SINGULAIR) 10 MG tablet Take 1 tablet once a day at bedtime to prevent shortness of breath, coughing and wheeze 10/24/18   Ambs, Norvel RichardsAnne M, FNP  Olopatadine HCl 0.2 % SOLN 1 drop in each eye as needed for red itchy eyes 10/24/18   Ambs, Norvel RichardsAnne M, FNP    Family History Family History  Problem Relation Age of Onset  . Allergic rhinitis Mother   . Angioedema Neg Hx   . Asthma Neg Hx   . Eczema Neg Hx   . Immunodeficiency Neg Hx   . Urticaria Neg Hx     Social History Social History   Tobacco Use  . Smoking status: Never Smoker  . Smokeless tobacco: Never Used  Substance Use Topics  . Alcohol use: No  . Drug use: Yes    Types: Marijuana    Comment: daily     Allergies   Patient has no known allergies.   Review of Systems Review of Systems  Respiratory: Positive for cough.    All other systems reviewed and are negative except that which was mentioned in HPI   Physical Exam Updated Vital Signs BP 114/67 (BP Location: Right Arm)   Pulse 86   Temp 98.5 F (36.9 C) (Oral)   Resp 18   SpO2 99%   Physical Exam Vitals signs and nursing note reviewed.  Constitutional:      General: She is not in acute distress.    Appearance: She is well-developed.  HENT:     Head: Normocephalic and atraumatic.     Mouth/Throat:     Mouth: Mucous membranes are moist.     Pharynx: Oropharynx is clear.     Comments: Tonsils  symmetric, uvula midline, mild erythema of tonsils w/ no exudates Eyes:     Conjunctiva/sclera: Conjunctivae normal.  Neck:     Musculoskeletal: Neck supple.  Cardiovascular:     Rate and Rhythm: Normal rate and regular rhythm.     Heart sounds: Normal heart sounds. No murmur.  Pulmonary:     Effort: Pulmonary effort is normal.     Breath sounds: Normal breath sounds.  Abdominal:     General: There is no distension.     Palpations: Abdomen is soft.     Tenderness: There is no abdominal tenderness.  Lymphadenopathy:     Cervical: No cervical adenopathy.  Skin:    General: Skin is warm and dry.  Neurological:     Mental Status: She is alert and oriented to person, place, and time.     Comments: Fluent speech  Psychiatric:        Judgment: Judgment normal.      ED Treatments / Results  Labs (all labs ordered are listed, but only abnormal results are displayed) Labs Reviewed  NOVEL CORONAVIRUS, NAA (HOSP ORDER, SEND-OUT TO REF LAB; TAT 18-24 HRS)    EKG None  Radiology No results found.  Procedures Procedures (including critical care time)  Medications Ordered in ED Medications - No data to display   Initial Impression / Assessment and Plan / ED Course  I have reviewed the triage vital signs and the nursing notes.         Sx suggestive of viral process. Given 1 out of 4 Cent[facility] criteria and no sick kids at home, doubt strep pharyngitis. Have recommended outpatient COVID-19 testing. Discussed strict quarantine until results are back and expected course if test is +. Also discussed return to work guidelines for negative test.  Extensively reviewed return precautions especially regarding any breathing issues.  She voiced understanding.  Sarah Drake was evaluated in Emergency Department on 06/08/2019 for the symptoms described in the history of present illness. She was evaluated in the context of the global COVID-19 pandemic, which necessitated  consideration that the patient might be at risk for infection with the SARS-CoV-2 virus that causes COVID-19. Institutional protocols and algorithms that pertain to the evaluation of patients at risk for COVID-19 are in a state of rapid change based on information released by regulatory bodies including the CDC and federal and state organizations. These policies and algorithms were followed during the patient's care in the ED.   Final Clinical Impressions(s) / ED Diagnoses   Final diagnoses:  Suspected COVID-19 virus infection  Sore throat  Body aches    ED Discharge Orders    None       Little, Wenda Overland, MD 06/08/19 1035

## 2019-06-08 NOTE — Discharge Instructions (Signed)
Person Under Monitoring Name: Sarah Drake  Location: [address] [place] [address]   Infection Prevention Recommendations for Individuals Confirmed to have, or Being Evaluated for, 2019 Novel Coronavirus (COVID-19) Infection Who Receive Care at Home  Individuals who are confirmed to have, or are being evaluated for, COVID-19 should follow the prevention steps below until a healthcare provider or local or state health department says they can return to normal activities.  Stay home except to get medical care You should restrict activities outside your home, except for getting medical care. Do not go to work, school, or public areas, and do not use public transportation or taxis.  Call ahead before visiting your doctor Before your medical appointment, call the healthcare provider and tell them that you have, or are being evaluated for, COVID-19 infection. This will help the healthcare providers office take steps to keep other people from getting infected. Ask your healthcare provider to call the local or state health department.  Monitor your symptoms Seek prompt medical attention if your illness is worsening (e.g., difficulty breathing). Before going to your medical appointment, call the healthcare provider and tell them that you have, or are being evaluated for, COVID-19 infection. Ask your healthcare provider to call the local or state health department.  Wear a facemask You should wear a facemask that covers your nose and mouth when you are in the same room with other people and when you visit a healthcare provider. People who live with or visit you should also wear a facemask while they are in the same room with you.  Separate yourself from other people in your home As much as possible, you should stay in a different room from other people in your home. Also, you should use a separate bathroom, if available.  Avoid sharing household items You should  not share dishes, drinking glasses, cups, eating utensils, towels, bedding, or other items with other people in your home. After using these items, you should wash them thoroughly with soap and water.  Cover your coughs and sneezes Cover your mouth and nose with a tissue when you cough or sneeze, or you can cough or sneeze into your sleeve. Throw used tissues in a lined trash can, and immediately wash your hands with soap and water for at least 20 seconds or use an alcohol-based hand rub.  Wash your [facility] your hands often and thoroughly with soap and water for at least 20 seconds. You can use an alcohol-based hand sanitizer if soap and water are not available and if your hands are not visibly dirty. Avoid touching your eyes, nose, and mouth with unwashed hands.   Prevention Steps for Caregivers and Household Members of Individuals Confirmed to have, or Being Evaluated for, COVID-19 Infection Being Cared for in the Home  If you live with, or provide care at home for, a person confirmed to have, or being evaluated for, COVID-19 infection please follow these guidelines to prevent infection:  Follow healthcare providers instructions Make sure that you understand and can help the patient follow any healthcare provider instructions for all care.  Provide for the patients basic needs You should help the patient with basic needs in the home and provide support for getting groceries, prescriptions, and other personal needs.  Monitor the patients symptoms If they are getting sicker, call his or her medical provider and tell them that the patient has, or is being evaluated for, COVID-19 infection. This will help the healthcare providers  office take steps to keep other people from getting infected. Ask the healthcare provider to call the local or state health department.  Limit the number of people who have contact with the patient If possible, have only one caregiver for the  patient. Other household members should stay in another home or place of residence. If this is not possible, they should stay in another room, or be separated from the patient as much as possible. Use a separate bathroom, if available. Restrict visitors who do not have an essential need to be in the home.  Keep older adults, very young children, and other sick people away from the patient Keep older adults, very young children, and those who have compromised immune systems or chronic health conditions away from the patient. This includes people with chronic heart, lung, or kidney conditions, diabetes, and cancer.  Ensure good ventilation Make sure that shared spaces in the home have good air flow, such as from an air conditioner or an opened window, weather permitting.  Wash your hands often Wash your hands often and thoroughly with soap and water for at least 20 seconds. You can use an alcohol based hand sanitizer if soap and water are not available and if your hands are not visibly dirty. Avoid touching your eyes, nose, and mouth with unwashed hands. Use disposable paper towels to dry your hands. If not available, use dedicated cloth towels and replace them when they become wet.  Wear a facemask and gloves Wear a disposable facemask at all times in the room and gloves when you touch or have contact with the patients blood, body fluids, and/or secretions or excretions, such as sweat, saliva, sputum, nasal mucus, vomit, urine, or feces.  Ensure the mask fits over your nose and mouth tightly, and do not touch it during use. Throw out disposable facemasks and gloves after using them. Do not reuse. Wash your hands immediately after removing your facemask and gloves. If your personal clothing becomes contaminated, carefully remove clothing and launder. Wash your hands after handling contaminated clothing. Place all used disposable facemasks, gloves, and other waste in a lined container before  disposing them with other household waste. Remove gloves and wash your hands immediately after handling these items.  Do not share dishes, glasses, or other household items with the patient Avoid sharing household items. You should not share dishes, drinking glasses, cups, eating utensils, towels, bedding, or other items with a patient who is confirmed to have, or being evaluated for, COVID-19 infection. After the person uses these items, you should wash them thoroughly with soap and water.  Wash laundry thoroughly Immediately remove and wash clothes or bedding that have blood, body fluids, and/or secretions or excretions, such as sweat, saliva, sputum, nasal mucus, vomit, urine, or feces, on them. Wear gloves when handling laundry from the patient. Read and follow directions on labels of laundry or clothing items and detergent. In general, wash and dry with the warmest temperatures recommended on the label.  Clean all areas the individual has used often Clean all touchable surfaces, such as counters, tabletops, doorknobs, bathroom fixtures, toilets, phones, keyboards, tablets, and bedside tables, every day. Also, clean any surfaces that may have blood, body fluids, and/or secretions or excretions on them. Wear gloves when cleaning surfaces the patient has come in contact with. Use a diluted bleach solution (e.g., dilute bleach with 1 part bleach and 10 parts water) or a household disinfectant with a label that says EPA-registered for coronaviruses. To make a  bleach solution at home, add 1 tablespoon of bleach to 1 quart (4 cups) of water. For a larger supply, add  cup of bleach to 1 gallon (16 cups) of water. Read labels of cleaning products and follow recommendations provided on product labels. Labels contain instructions for safe and effective use of the cleaning product including precautions you should take when applying the product, such as wearing gloves or eye protection and making sure you  have good ventilation during use of the product. Remove gloves and wash hands immediately after cleaning.  Monitor yourself for signs and symptoms of illness Caregivers and household members are considered close contacts, should monitor their health, and will be asked to limit movement outside of the home to the extent possible. Follow the monitoring steps for close contacts listed on the symptom monitoring form.   ? If you have additional questions, contact your local health department or call the epidemiologist on call at [phone] (available 24/7). ? This guidance is subject to change. For the most up-to-date guidance from [facility], please refer to their website: [email or web address]

## 2019-06-08 NOTE — ED Triage Notes (Signed)
Pt states awoke today with sore throat, mild cough and body aches.  Suspects coworker may have been experiencing covid symptoms but not yet tested.  Denies fevers

## 2019-06-09 LAB — NOVEL CORONAVIRUS, NAA (HOSP ORDER, SEND-OUT TO REF LAB; TAT 18-24 HRS)
Coronavirus Source: NONE SEEN
SARS-CoV-2, NAA: NOT DETECTED

## 2019-07-25 ENCOUNTER — Other Ambulatory Visit: Payer: Self-pay | Admitting: Family Medicine

## 2019-07-31 ENCOUNTER — Other Ambulatory Visit: Payer: Self-pay | Admitting: Family Medicine

## 2019-10-16 ENCOUNTER — Other Ambulatory Visit: Payer: Self-pay

## 2019-10-16 ENCOUNTER — Emergency Department (HOSPITAL_COMMUNITY)
Admission: EM | Admit: 2019-10-16 | Discharge: 2019-10-16 | Disposition: A | Discharge status: Home / Self Care | Payer: BC Managed Care – PPO | Attending: Emergency Medicine | Admitting: Emergency Medicine

## 2019-10-16 ENCOUNTER — Encounter (HOSPITAL_COMMUNITY): Payer: Self-pay | Admitting: Emergency Medicine

## 2019-10-16 DIAGNOSIS — F1290 Cannabis use, unspecified, uncomplicated: Secondary | ICD-10-CM | POA: Diagnosis not present

## 2019-10-16 DIAGNOSIS — R5383 Other fatigue: Secondary | ICD-10-CM | POA: Insufficient documentation

## 2019-10-16 DIAGNOSIS — E860 Dehydration: Secondary | ICD-10-CM

## 2019-10-16 DIAGNOSIS — R112 Nausea with vomiting, unspecified: Secondary | ICD-10-CM

## 2019-10-16 DIAGNOSIS — R197 Diarrhea, unspecified: Secondary | ICD-10-CM | POA: Diagnosis not present

## 2019-10-16 LAB — URINALYSIS, ROUTINE W REFLEX MICROSCOPIC
Bilirubin Urine: NEGATIVE
Glucose, UA: NEGATIVE mg/dL
Ketones, ur: NEGATIVE mg/dL
Leukocytes,Ua: NEGATIVE
Nitrite: NEGATIVE
Protein, ur: NEGATIVE mg/dL
Specific Gravity, Urine: 1.013 (ref 1.005–1.030)
pH: 7 (ref 5.0–8.0)

## 2019-10-16 LAB — CBC
HCT: 37.2 % (ref 36.0–46.0)
Hemoglobin: 11.8 g/dL — ABNORMAL LOW (ref 12.0–15.0)
MCH: 30.1 pg (ref 26.0–34.0)
MCHC: 31.7 g/dL (ref 30.0–36.0)
MCV: 94.9 fL (ref 80.0–100.0)
Platelets: 208 10*3/uL (ref 150–400)
RBC: 3.92 MIL/uL (ref 3.87–5.11)
RDW: 15 % (ref 11.5–15.5)
WBC: 3.4 10*3/uL — ABNORMAL LOW (ref 4.0–10.5)
nRBC: 0 % (ref 0.0–0.2)

## 2019-10-16 LAB — COMPREHENSIVE METABOLIC PANEL
ALT: 23 U/L (ref 0–44)
AST: 26 U/L (ref 15–41)
Albumin: 4 g/dL (ref 3.5–5.0)
Alkaline Phosphatase: 37 U/L — ABNORMAL LOW (ref 38–126)
Anion gap: 13 (ref 5–15)
BUN: 10 mg/dL (ref 6–20)
CO2: 22 mmol/L (ref 22–32)
Calcium: 9.1 mg/dL (ref 8.9–10.3)
Chloride: 105 mmol/L (ref 98–111)
Creatinine, Ser: 0.6 mg/dL (ref 0.44–1.00)
GFR calc Af Amer: >60 mL/min (ref >60)
GFR calc non Af Amer: >60 mL/min (ref >60)
Glucose, Bld: 145 mg/dL — ABNORMAL HIGH (ref 70–99)
Potassium: 3.7 mmol/L (ref 3.5–5.1)
Sodium: 140 mmol/L (ref 135–145)
Total Bilirubin: 0.3 mg/dL (ref 0.3–1.2)
Total Protein: 7.7 g/dL (ref 6.5–8.1)

## 2019-10-16 LAB — I-STAT BETA HCG BLOOD, ED (MC, WL, AP ONLY): I-stat hCG, quantitative: <5 m[IU]/mL (ref <5)

## 2019-10-16 LAB — LIPASE, BLOOD: Lipase: 19 U/L (ref 11–51)

## 2019-10-16 MED ORDER — SODIUM CHLORIDE 0.9 % IV BOLUS (SEPSIS)
1000.0000 mL | Freq: Once | INTRAVENOUS | Status: AC
  Administered 2019-10-16: 05:00:00 1000 mL via INTRAVENOUS

## 2019-10-16 MED ORDER — ONDANSETRON HCL 4 MG/2ML IJ SOLN
4.0000 mg | Freq: Once | INTRAMUSCULAR | Status: AC
  Administered 2019-10-16: 05:00:00 4 mg via INTRAVENOUS
  Filled 2019-10-16: qty 2

## 2019-10-16 MED ORDER — FENTANYL CITRATE (PF) 100 MCG/2ML IJ SOLN
100.0000 ug | Freq: Once | INTRAMUSCULAR | Status: AC
  Administered 2019-10-16: 100 ug via INTRAVENOUS
  Filled 2019-10-16: qty 2

## 2019-10-16 MED ORDER — SODIUM CHLORIDE 0.9% FLUSH: 3.0000 mL | Freq: Once | INTRAVENOUS | Status: DC

## 2019-10-16 MED ORDER — ONDANSETRON HCL 8 MG PO TABS: 8.0000 mg | ORAL_TABLET | Freq: Three times a day (TID) | ORAL | 0 refills | Status: DC | PRN

## 2019-10-16 MED ORDER — SODIUM CHLORIDE 0.9 % IV BOLUS (SEPSIS)
1000.0000 mL | Freq: Once | INTRAVENOUS | Status: AC
  Administered 2019-10-16: 1000 mL via INTRAVENOUS

## 2019-10-16 MED ORDER — ONDANSETRON 4 MG PO TBDP
4.0000 mg | ORAL_TABLET | Freq: Once | ORAL | Status: AC | PRN
  Administered 2019-10-16: 01:00:00 4 mg via ORAL
  Filled 2019-10-16: qty 1

## 2019-10-16 NOTE — ED Notes (Signed)
Patient verbalizes understanding of discharge instructions. Opportunity for questioning and answers were provided. Armband removed by staff, pt discharged from ED. Ambulated out to lobby  

## 2019-10-16 NOTE — ED Notes (Signed)
Pt tolerating PO fluids

## 2019-10-16 NOTE — Discharge Instructions (Addendum)

## 2019-10-16 NOTE — ED Provider Notes (Signed)
Patient is appropriate for d/c home.  I doubt acute abdominal emergency at this time.  We discussed strict ER return precautions including abdominal pain that migrates to RLQ, fever >100.52F with repetitive vomiting over next 8-12 hours   Zadie Rhine, MD 10/16/19 0725

## 2019-10-16 NOTE — ED Provider Notes (Signed)
[facility] EMERGENCY DEPARTMENT Provider Note   CSN: [id number] Arrival date & time: 10/16/19  0010     History Chief Complaint  Patient presents with  . Emesis    Sarah Drake is a 34 y.o. female.  The history is provided by the patient.  Emesis Severity:  Moderate Timing:  Intermittent Chronicity:  New Relieved by:  Nothing Worsened by:  Nothing Associated symptoms: abdominal pain and diarrhea   Associated symptoms: no cough and no fever   Patient presents with multiple episodes of nausea vomiting, and small amount of diarrhea.  She reports is been no blood in the stool or vomit.  She reports diffuse abdominal pain.  She reports shortness of breath.  No chest pain or cough. No sick contacts.     Past Medical History:  Diagnosis Date  . Anemia    low iron  . Asthma   . Complication of anesthesia    post op nausea and vomiting  . Depression    chronic depression    Patient Active Problem List   Diagnosis Date Noted  . Seasonal allergic conjunctivitis 10/24/2018  . Cholecystitis with cholelithiasis 07/09/2018  . Seasonal and perennial allergic rhinitis 11/01/2017  . Moderate persistent asthma without complication 05/31/2015  . Allergic rhinitis 05/31/2015    Past Surgical History:  Procedure Laterality Date  . BALLOON DILATION N/A 07/11/2018   Procedure: BALLOON DILATION;  Surgeon: Meridee Score Netty Starring., MD;  Location: [facility];  Service: Gastroenterology;  Laterality: N/A;  . BILIARY STENT PLACEMENT N/A 07/11/2018   Procedure: BILIARY STENT PLACEMENT;  Surgeon: Meridee Score Netty Starring., MD;  Location: [facility];  Service: Gastroenterology;  Laterality: N/A;  10x 7  . BIOPSY  07/11/2018   Procedure: BIOPSY;  Surgeon: Meridee Score Netty Starring., MD;  Location: [facility];  Service: Gastroenterology;;  . CESAREAN SECTION    . CHOLECYSTECTOMY N/A 07/10/2018   Procedure: LAPAROSCOPIC CHOLECYSTECTOMY WITH INTRAOPERATIVE  CHOLANGIOGRAM, 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;  Surgeon: Ovidio Kin, MD;  Location: [facility]S;  Service: General;  Laterality: N/A;  . CHOLECYSTECTOMY    . ERCP N/A 07/11/2018   Procedure: ENDOSCOPIC RETROGRADE CHOLANGIOPANCREATOGRAPHY (ERCP);  Surgeon: Lemar Lofty., MD;  Location: [facility];  Service: Gastroenterology;  Laterality: N/A;  . ERCP N/A 10/05/2018   Procedure: ENDOSCOPIC RETROGRADE CHOLANGIOPANCREATOGRAPHY (ERCP);  Surgeon: Lemar Lofty., MD;  Location: [facility] ENDOSCOPY;  Service: Gastroenterology;  Laterality: N/A;  . PANCREATIC STENT PLACEMENT  07/11/2018   Procedure: PANCREATIC STENT PLACEMENT;  Surgeon: Meridee Score Netty Starring., MD;  Location: [facility] ENDOSCOPY;  Service: Gastroenterology;;  4x7  . REMOVAL OF STONES  07/11/2018   Procedure: REMOVAL OF STONES;  Surgeon: Meridee Score Netty Starring., MD;  Location: [facility];  Service: Gastroenterology;;  . REMOVAL OF STONES  10/05/2018   Procedure: REMOVAL OF STONES;  Surgeon: Lemar Lofty., MD;  Location: [facility] ENDOSCOPY;  Service: Gastroenterology;;  . Dennison Mascot  07/11/2018   Procedure: Dennison Mascot;  Surgeon: Lemar Lofty., MD;  Location: [facility];  Service: Gastroenterology;;  . Francine Graven REMOVAL  10/05/2018   Procedure: STENT REMOVAL;  Surgeon: Lemar Lofty., MD;  Location: [facility] ENDOSCOPY;  Service: Gastroenterology;;  . TUBAL LIGATION       OB History    Gravida  1   Para      Term      Preterm      AB      Living        SAB  TAB      Ectopic      Multiple      Live Births              Family History  Problem Relation Age of Onset  . Allergic rhinitis Mother   . Angioedema Neg Hx   . Asthma Neg Hx   . Eczema Neg Hx   . Immunodeficiency Neg Hx   . Urticaria Neg Hx     Social History   Tobacco Use  . Smoking status: Never Smoker  . Smokeless tobacco: Never Used  Substance Use Topics  . Alcohol use: No  . Drug use:  Yes    Types: Marijuana    Comment: daily    Home Medications Prior to Admission medications   Medication Sig Start Date End Date Taking? Authorizing Provider  albuterol (PROAIR HFA) 108 (90 Base) MCG/ACT inhaler Inhale 2 puffs into the lungs every 4 (four) hours as needed for wheezing or shortness of breath. 10/14/18   Hetty Blend, FNP  albuterol (PROAIR HFA) 108 (90 Base) MCG/ACT inhaler Inhale 2 puffs into the lungs every 4 (four) hours as needed for wheezing or shortness of breath. 10/24/18   Hetty Blend, FNP  albuterol (PROVENTIL) (2.5 MG/3ML) 0.083% nebulizer solution Take 3 mLs (2.5 mg total) by nebulization every 6 (six) hours as needed for wheezing or shortness of breath. 11/21/18   Hetty Blend, FNP  fluticasone (FLOVENT HFA) 110 MCG/ACT inhaler 2 puffs once a day with a spacer to prevent coughing and wheeze rinse and spit after use 10/24/18   Ambs, Norvel Richards, FNP  ibuprofen (ADVIL,MOTRIN) 200 MG tablet Take 1 tablet (200 mg total) by mouth every 6 (six) hours as needed for headache or moderate pain. 10/12/18   Mansouraty, Netty Starring., MD  montelukast (SINGULAIR) 10 MG tablet Take 1 tablet once a day at bedtime to prevent shortness of breath, coughing and wheeze 10/24/18   Hetty Blend, FNP  Olopatadine HCl 0.2 % SOLN 1 drop in each eye as needed for red itchy eyes 10/24/18   Ambs, Norvel Richards, FNP    Allergies    Patient has no known allergies.  Review of Systems   Review of Systems  Constitutional: Positive for fatigue. Negative for fever.  Respiratory: Negative for cough.   Gastrointestinal: Positive for abdominal pain, diarrhea and vomiting.  All other systems reviewed and are negative.   Physical Exam Updated Vital Signs BP (!) 135/95 (BP Location: Right Arm)   Pulse 83   Temp 97.6 F (36.4 C) (Oral)   Resp 16   Ht 1.651 m (5\' 5" )   Wt 105 kg   LMP 10/13/2019   SpO2 96%   BMI 38.52 kg/m   Physical Exam CONSTITUTIONAL: Well developed/well nourished, uncomfortable  appearing HEAD: Normocephalic/atraumatic EYES: EOMI/PERRL, no icterus ENMT: Mask in place NECK: supple no meningeal signs SPINE/BACK:entire spine nontender CV: S1/S2 noted, no murmurs/rubs/gallops noted LUNGS: Lungs are clear to auscultation bilaterally, no apparent distress ABDOMEN: soft, nontender, no rebound or guarding, bowel sounds noted throughout abdomen GU:no cva tenderness NEURO: Pt is awake/alert/appropriate, moves all extremitiesx4.  No facial droop.   EXTREMITIES: pulses normal/equal, full ROM SKIN: warm, color normal PSYCH: Anxious ED Results / Procedures / Treatments   Labs (all labs ordered are listed, but only abnormal results are displayed) Labs Reviewed  COMPREHENSIVE METABOLIC PANEL - Abnormal; Notable for the following components:      Result Value   Glucose, Bld 145 (*)  Alkaline Phosphatase 37 (*)    All other components within normal limits  CBC - Abnormal; Notable for the following components:   WBC 3.4 (*)    Hemoglobin 11.8 (*)    All other components within normal limits  LIPASE, BLOOD  URINALYSIS, ROUTINE W REFLEX MICROSCOPIC  I-STAT BETA HCG BLOOD, ED ([facility], [facility], [facility] ONLY)    EKG ED ECG REPORT   Date: 10/16/2019 0446am  Rate: 66  Rhythm: normal sinus rhythm  QRS Axis: normal  Intervals: normal  ST/T Wave abnormalities: nonspecific ST changes  Conduction Disutrbances:none  Narrative Interpretation:   Old EKG Reviewed: none available  I have personally reviewed the EKG tracing and agree with the computerized printout as noted.  Radiology No results found.  Procedures Procedures   Medications Ordered in ED Medications  sodium chloride flush (NS) 0.9 % injection 3 mL (has no administration in time range)  ondansetron (ZOFRAN-ODT) disintegrating tablet 4 mg (4 mg Oral Given 10/16/19 0037)  sodium chloride 0.9 % bolus 1,000 mL (0 mLs Intravenous Stopped 10/16/19 0517)  ondansetron (ZOFRAN) injection 4 mg (4 mg Intravenous Given 10/16/19 0440)   sodium chloride 0.9 % bolus 1,000 mL (0 mLs Intravenous Stopped 10/16/19 0624)  ondansetron (ZOFRAN) injection 4 mg (4 mg Intravenous Given 10/16/19 0525)  fentaNYL (SUBLIMAZE) injection 100 mcg (100 mcg Intravenous Given 10/16/19 0525)    ED Course  I have reviewed the triage vital signs and the nursing notes.  Pertinent labs  results that were available during my care of the patient were reviewed by me and considered in my medical decision making (see chart for details).    MDM Rules/Calculators/A&P                      Patient presented for nausea vomiting and some diarrhea.  She appeared uncomfortable initially, but is now feeling improved She is taking p.o. fluids and talking on the phone. Low suspicion for acute abdominal emergency. We will start Zofran for patient.  We discussed strict return precautions Final Clinical Impression(s) / ED Diagnoses Final diagnoses:  Nausea vomiting and diarrhea  Dehydration    Rx / DC Orders ED Discharge Orders         Ordered    ondansetron (ZOFRAN) 8 MG tablet  Every 8 hours PRN     10/16/19 0713           Ripley Fraise, MD 10/16/19 [id number]

## 2019-10-16 NOTE — ED Triage Notes (Signed)
Patient reports persistent emesis this evening , denies diarrhea or fever , no cough /respirations unlabored .

## 2019-12-28 ENCOUNTER — Emergency Department (HOSPITAL_BASED_OUTPATIENT_CLINIC_OR_DEPARTMENT_OTHER)
Admission: EM | Admit: 2019-12-28 | Discharge: 2019-12-28 | Disposition: A | Discharge status: Home / Self Care | Payer: BC Managed Care – PPO | Attending: Emergency Medicine | Admitting: Emergency Medicine

## 2019-12-28 ENCOUNTER — Encounter (HOSPITAL_BASED_OUTPATIENT_CLINIC_OR_DEPARTMENT_OTHER): Payer: Self-pay

## 2019-12-28 ENCOUNTER — Other Ambulatory Visit: Payer: Self-pay

## 2019-12-28 DIAGNOSIS — J45909 Unspecified asthma, uncomplicated: Secondary | ICD-10-CM | POA: Diagnosis not present

## 2019-12-28 DIAGNOSIS — G5603 Carpal tunnel syndrome, bilateral upper limbs: Secondary | ICD-10-CM | POA: Diagnosis not present

## 2019-12-28 DIAGNOSIS — R200 Anesthesia of skin: Secondary | ICD-10-CM | POA: Diagnosis present

## 2019-12-28 DIAGNOSIS — Z79899 Other long term (current) drug therapy: Secondary | ICD-10-CM | POA: Diagnosis not present

## 2019-12-28 DIAGNOSIS — R202 Paresthesia of skin: Secondary | ICD-10-CM

## 2019-12-28 MED ORDER — DICLOFENAC SODIUM 50 MG PO TBEC: 50.0000 mg | DELAYED_RELEASE_TABLET | Freq: Two times a day (BID) | ORAL | 0 refills | Status: DC

## 2019-12-28 NOTE — ED Provider Notes (Addendum)
[facility] EMERGENCY DEPARTMENT Provider Note   CSN: [id number] Arrival date & time: 12/28/19  0848     History Chief Complaint  Patient presents with  . Numbness    Sarah Drake is a 34 y.o. female.  The history is provided by the patient. No language interpreter was used.  Hand Pain This is a new problem. Episode onset: 6 MONTHS. The problem occurs constantly. The problem has been gradually worsening. The symptoms are aggravated by twisting. Nothing relieves the symptoms. She has tried nothing for the symptoms. The treatment provided no relief.  Pt complains of numbness in right and left 2nd, 3rd and 4th fingers.  Pt reports she was unable to do her job today.       Past Medical History:  Diagnosis Date  . Anemia    low iron  . Asthma   . Complication of anesthesia    post op nausea and vomiting  . Depression    chronic depression    Patient Active Problem List   Diagnosis Date Noted  . Seasonal allergic conjunctivitis 10/24/2018  . Cholecystitis with cholelithiasis 07/09/2018  . Seasonal and perennial allergic rhinitis 11/01/2017  . Moderate persistent asthma without complication 05/31/2015  . Allergic rhinitis 05/31/2015    Past Surgical History:  Procedure Laterality Date  . BALLOON DILATION N/A 07/11/2018   Procedure: BALLOON DILATION;  Surgeon: Meridee Score Netty Starring., MD;  Location: [facility];  Service: Gastroenterology;  Laterality: N/A;  . BILIARY STENT PLACEMENT N/A 07/11/2018   Procedure: BILIARY STENT PLACEMENT;  Surgeon: Meridee Score Netty Starring., MD;  Location: [facility];  Service: Gastroenterology;  Laterality: N/A;  10x 7  . BIOPSY  07/11/2018   Procedure: BIOPSY;  Surgeon: Meridee Score Netty Starring., MD;  Location: [facility];  Service: Gastroenterology;;  . CESAREAN SECTION    . CHOLECYSTECTOMY N/A 07/10/2018   Procedure: LAPAROSCOPIC CHOLECYSTECTOMY WITH INTRAOPERATIVE CHOLANGIOGRAM, EXPLORATION OF COMMON BILE DUCT WITH  BILIARY FOGARTY;  Surgeon: Ovidio Kin, MD;  Location: [facility];  Service: General;  Laterality: N/A;  . CHOLECYSTECTOMY    . ERCP N/A 07/11/2018   Procedure: ENDOSCOPIC RETROGRADE CHOLANGIOPANCREATOGRAPHY (ERCP);  Surgeon: Lemar Lofty., MD;  Location: [facility];  Service: Gastroenterology;  Laterality: N/A;  . ERCP N/A 10/05/2018   Procedure: ENDOSCOPIC RETROGRADE CHOLANGIOPANCREATOGRAPHY (ERCP);  Surgeon: Lemar Lofty., MD;  Location: [facility];  Service: Gastroenterology;  Laterality: N/A;  . PANCREATIC STENT PLACEMENT  07/11/2018   Procedure: PANCREATIC STENT PLACEMENT;  Surgeon: Meridee Score Netty Starring., MD;  Location: [facility];  Service: Gastroenterology;;  4x7  . REMOVAL OF STONES  07/11/2018   Procedure: REMOVAL OF STONES;  Surgeon: Meridee Score Netty Starring., MD;  Location: [facility];  Service: Gastroenterology;;  . REMOVAL OF STONES  10/05/2018   Procedure: REMOVAL OF STONES;  Surgeon: Lemar Lofty., MD;  Location: [facility] ENDOSCOPY;  Service: Gastroenterology;;  . Dennison Mascot  07/11/2018   Procedure: Dennison Mascot;  Surgeon: Lemar Lofty., MD;  Location: [facility];  Service: Gastroenterology;;  . Francine Graven REMOVAL  10/05/2018   Procedure: STENT REMOVAL;  Surgeon: Lemar Lofty., MD;  Location: [facility] ENDOSCOPY;  Service: Gastroenterology;;  . TUBAL LIGATION       OB History    Gravida  1   Para      Term      Preterm      AB      Living        SAB      TAB  Ectopic      Multiple      Live Births              Family History  Problem Relation Age of Onset  . Allergic rhinitis Mother   . Angioedema Neg Hx   . Asthma Neg Hx   . Eczema Neg Hx   . Immunodeficiency Neg Hx   . Urticaria Neg Hx     Social History   Tobacco Use  . Smoking status: Never Smoker  . Smokeless tobacco: Never Used  Substance Use Topics  . Alcohol use: Yes    Comment: socail  . Drug use: Yes    Types: Marijuana     Comment: daily    Home Medications Prior to Admission medications   Medication Sig Start Date End Date Taking? Authorizing Provider  albuterol (PROAIR HFA) 108 (90 Base) MCG/ACT inhaler Inhale 2 puffs into the lungs every 4 (four) hours as needed for wheezing or shortness of breath. 10/14/18   Dara Hoyer, FNP  albuterol (PROAIR HFA) 108 (90 Base) MCG/ACT inhaler Inhale 2 puffs into the lungs every 4 (four) hours as needed for wheezing or shortness of breath. 10/24/18   Dara Hoyer, FNP  albuterol (PROVENTIL) (2.5 MG/3ML) 0.083% nebulizer solution Take 3 mLs (2.5 mg total) by nebulization every 6 (six) hours as needed for wheezing or shortness of breath. 11/21/18   Dara Hoyer, FNP  diclofenac (VOLTAREN) 50 MG EC tablet Take 1 tablet (50 mg total) by mouth 2 (two) times daily. 12/28/19   Fransico Meadow, PA-C  fluticasone (FLOVENT HFA) 110 MCG/ACT inhaler 2 puffs once a day with a spacer to prevent coughing and wheeze rinse and spit after use 10/24/18   Ambs, Kathrine Cords, FNP  ibuprofen (ADVIL,MOTRIN) 200 MG tablet Take 1 tablet (200 mg total) by mouth every 6 (six) hours as needed for headache or moderate pain. 10/12/18   Mansouraty, Telford Nab., MD  montelukast (SINGULAIR) 10 MG tablet Take 1 tablet once a day at bedtime to prevent shortness of breath, coughing and wheeze 10/24/18   Ambs, Kathrine Cords, FNP  Olopatadine HCl 0.2 % SOLN 1 drop in each eye as needed for red itchy eyes 10/24/18   Ambs, Kathrine Cords, FNP  ondansetron (ZOFRAN) 8 MG tablet Take 1 tablet (8 mg total) by mouth every 8 (eight) hours as needed for nausea. 10/16/19   Ripley Fraise, MD    Allergies    Patient has no known allergies.  Review of Systems   Review of Systems  All other systems reviewed and are negative.   Physical Exam Updated Vital Signs BP 121/66 (BP Location: Right Arm)   Pulse 85   Temp 97.8 F (36.6 C) (Oral)   Resp 18   Ht 5\' 5"  (1.651 m)   Wt 106.1 kg   LMP 12/14/2019   SpO2 100%   BMI 38.94 kg/m   Physical  Exam Vitals and nursing note reviewed.  Constitutional:      Appearance: She is well-developed.  HENT:     Head: Normocephalic.  Pulmonary:     Effort: Pulmonary effort is normal.  Abdominal:     General: There is no distension.  Musculoskeletal:        General: No swelling or signs of injury. Normal range of motion.     Cervical back: Normal range of motion.     Comments: Phalen negative,  Slightly positive Tinel   Skin:    General: Skin is  warm.  Neurological:     Mental Status: She is alert and oriented to person, place, and time.  Psychiatric:        Mood and Affect: Mood normal.     ED Results / Procedures / Treatments   Labs (all labs ordered are listed, but only abnormal results are displayed) Labs Reviewed - No data to display  EKG None  Radiology No results found.  Procedures Procedures (including critical care time)  Medications Ordered in ED Medications - No data to display  ED Course  I have reviewed the triage vital signs and the nursing notes.  Pertinent labs & imaging results that were available during my care of the patient were reviewed by me and considered in my medical decision making (see chart for details).    MDM Rules/Calculators/A&P                      MDM: Pt counseled on possible carpal tunnel vs tendonitis.  Pt given rx for diflucan and placed in bilat wrist splint.  Pt referred to hand for evaluation  Out of work until Monday Final Clinical Impression(s) / ED Diagnoses Final diagnoses:  Paresthesia  Bilateral carpal tunnel syndrome    Rx / DC Orders ED Discharge Orders         Ordered    diclofenac (VOLTAREN) 50 MG EC tablet  2 times daily     12/28/19 6301        An After Visit Summary was printed and given to the patient.    Elson Areas, PA-C 12/28/19 1127    Osie Cheeks 12/28/19 1129    Gwyneth Sprout, MD 12/28/19 1130

## 2019-12-28 NOTE — ED Triage Notes (Signed)
Pt states that she has been having numbness in both hands for around 6 months. 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

## 2019-12-28 NOTE — Discharge Instructions (Signed)
Return if any problems.

## 2020-01-12 ENCOUNTER — Ambulatory Visit (INDEPENDENT_AMBULATORY_CARE_PROVIDER_SITE_OTHER): Payer: BC Managed Care – PPO | Admitting: Family Medicine

## 2020-01-12 ENCOUNTER — Encounter: Payer: Self-pay | Admitting: Family Medicine

## 2020-01-12 ENCOUNTER — Other Ambulatory Visit: Payer: Self-pay

## 2020-01-12 VITALS — BP 112/72 | HR 75 | Temp 97.7°F | Resp 16 | Ht 66.0 in | Wt 237.0 lb

## 2020-01-12 DIAGNOSIS — J302 Other seasonal allergic rhinitis: Secondary | ICD-10-CM

## 2020-01-12 DIAGNOSIS — K219 Gastro-esophageal reflux disease without esophagitis: Secondary | ICD-10-CM | POA: Diagnosis not present

## 2020-01-12 DIAGNOSIS — H1010 Acute atopic conjunctivitis, unspecified eye: Secondary | ICD-10-CM

## 2020-01-12 DIAGNOSIS — J4541 Moderate persistent asthma with (acute) exacerbation: Secondary | ICD-10-CM | POA: Diagnosis not present

## 2020-01-12 DIAGNOSIS — J3089 Other allergic rhinitis: Secondary | ICD-10-CM | POA: Diagnosis not present

## 2020-01-12 MED ORDER — FLUTICASONE PROPIONATE 50 MCG/ACT NA SUSP
2.0000 | Freq: Every day | NASAL | 5 refills | Status: DC | PRN
Start: 2020-01-12 — End: 2020-11-14

## 2020-01-12 MED ORDER — FAMOTIDINE 20 MG PO TABS
20.0000 mg | ORAL_TABLET | Freq: Two times a day (BID) | ORAL | 5 refills | Status: DC
Start: 2020-01-12 — End: 2020-11-14

## 2020-01-12 MED ORDER — ALBUTEROL SULFATE HFA 108 (90 BASE) MCG/ACT IN AERS: 2.0000 | INHALATION_SPRAY | RESPIRATORY_TRACT | 2 refills | Status: DC | PRN

## 2020-01-12 MED ORDER — MONTELUKAST SODIUM 10 MG PO TABS: ORAL_TABLET | ORAL | 5 refills | Status: DC

## 2020-01-12 MED ORDER — ALBUTEROL SULFATE (2.5 MG/3ML) 0.083% IN NEBU: 2.5000 mg | INHALATION_SOLUTION | Freq: Four times a day (QID) | RESPIRATORY_TRACT | 1 refills | Status: DC | PRN

## 2020-01-12 MED ORDER — FLOVENT HFA 110 MCG/ACT IN AERO: INHALATION_SPRAY | RESPIRATORY_TRACT | 5 refills | Status: DC

## 2020-01-12 NOTE — Progress Notes (Signed)
[address] [place] [address] Dept: [phone]  FOLLOW UP NOTE  Patient ID: Sarah Drake, female    DOB: 04/04/1986  Age: 34 y.o. MRN: [id number] Date of Office Visit: 01/12/2020  Assessment  Chief Complaint: Asthma and Allergies (sneezing)  HPI Sarah Drake is a 34 year old female who presents to the clinic for follow-up visit.  She was last seen in this clinic on 10/24/2018 for evaluation of asthma, allergic rhinitis, and allergic conjunctivitis.  At today's visit she reports her asthma has been" okay" with symptoms including shortness of breath with activity, no wheezing, and dry intermittent cough.  She reports she is not currently taking montelukast although it had worked for her on a previous occasion.  She continues Flovent 110 2 puffs in the morning with a spacer and uses albuterol about 2-3 times a week.  Allergic rhinitis is reported as moderately well controlled with sneezing and nasal congestion as the main symptoms for which she has started taking cetirizine 10 mg 1-2 times a day.  She is not currently using Flonase or any saline nasal rinses.  Allergic conjunctivitis is reported as well controlled with no medical intervention at this time.  She does report frequent heartburn for which she is not currently taking any medication.  Her current medications are listed in the chart.   Drug Allergies:  No Known Allergies  Physical Exam: BP 112/72   Pulse 75   Temp 97.7 F (36.5 C) (Oral)   Resp 16   Ht 5\' 6"  (1.676 m)   Wt 237 lb (107.5 kg)   LMP 12/14/2019   SpO2 100%   BMI 38.25 kg/m    Physical Exam Vitals reviewed.  Constitutional:      Appearance: Normal appearance.  HENT:     Head: Normocephalic and atraumatic.     Right Ear: Tympanic membrane normal.     Left Ear: Tympanic membrane normal.     Nose:     Comments: Bilateral nares slightly erythematous with clear nasal drainage noted.  Pharynx normal.  Ears normal.  Eyes normal.  Mouth/Throat:     Pharynx: Oropharynx is clear.  Eyes:     Conjunctiva/sclera: Conjunctivae normal.  Cardiovascular:     Rate and Rhythm: Normal rate and regular rhythm.     Heart sounds: Normal heart sounds. No murmur.  Pulmonary:     Effort: Pulmonary effort is normal.     Breath sounds: Wheezing present.     Comments: Slight expiratory wheeze noted which cleared post bronchodilator treatment Musculoskeletal:        General: Normal range of motion.     Cervical back: Normal range of motion and neck supple.  Skin:    General: Skin is warm and dry.  Neurological:     Mental Status: She is alert and oriented to person, place, and time.  Psychiatric:        Mood and Affect: Mood normal.        Behavior: Behavior normal.        Thought Content: Thought content normal.        Judgment: Judgment normal.     Diagnostics: FVC 3.49, FEV1 2.67.  Predicted FVC 3.42, predicted FEV1 2.88.  Spirometry indicates normal ventilatory function.  Postbronchodilator spirometry indicates FVC 3.99, FEV1 3.12.  Postbronchodilator spirometry indicates normal ventilatory function with a 17% improvement in FEV1 and 14% improvement in FVC.  Assessment and Plan: 1. Moderate persistent asthma with acute exacerbation   2. Seasonal and  perennial allergic rhinitis   3. Seasonal allergic conjunctivitis   4. Gastroesophageal reflux disease, unspecified whether esophagitis present     Meds ordered this encounter  Medications  . albuterol (PROAIR HFA) 108 (90 Base) MCG/ACT inhaler    Sig: Inhale 2 puffs into the lungs every 4 (four) hours as needed for wheezing or shortness of breath.    Dispense:  8 g    Refill:  2    Courtesy refill. No more. Pt. Does have a scheduled appointment.  Marland Kitchen albuterol (PROVENTIL) (2.5 MG/3ML) 0.083% nebulizer solution    Sig: Take 3 mLs (2.5 mg total) by nebulization every 6 (six) hours as needed for wheezing or shortness of breath.    Dispense:  75 mL    Refill:  1  .  fluticasone (FLOVENT HFA) 110 MCG/ACT inhaler    Sig: 2 puffs once a day with a spacer to prevent coughing and wheeze rinse and spit after use    Dispense:  1 Inhaler    Refill:  5  . montelukast (SINGULAIR) 10 MG tablet    Sig: Take 1 tablet once a day at bedtime to prevent shortness of breath, coughing and wheeze    Dispense:  30 tablet    Refill:  5  . fluticasone (FLONASE) 50 MCG/ACT nasal spray    Sig: Place 2 sprays into both nostrils daily as needed (for runny nose).    Dispense:  16 g    Refill:  5  . famotidine (PEPCID) 20 MG tablet    Sig: Take 1 tablet (20 mg total) by mouth 2 (two) times daily.    Dispense:  60 tablet    Refill:  5    Patient Instructions  Asthma Begin montelukast 10 mg once a day at bedtime to prevent shortness of breath, cough, and wheeze Increase Flovent 110- 2 puffs twice a day with a spacer to prevent cough and wheeze. Rinse and spit after each use. ProAir 2 puffs every 4 hours as needed for cough and wheeze Use ProAir 2 puffs about 5-15 minutes before exercise to decrease cough and wheeze with activity   Allergic rhinitis/allergic conjunctivitis Continue Zyrtec 10 mg once a day as needed for runny nose or sneezing Consider nasal saline rinses before any medicated nasal sprays Begin Flonase nasal spray 2 sprays per nostril once a day as needed for a runny nose Some over the counter eye drops include Pataday one drop in each eye once a day as needed for red, itchy eyes OR Zaditor one drop in each eye twice a day as needed for red itchy eyes.  Reflux Begin famotidine 20 mg twice a day to help control reflux Begin dietary and lifestyle modifications as listed below  Call me if this treatment plan is not working for you  Follow up in 6 months or sooner if needed   Return in about 6 months (around 07/14/2020), or if symptoms worsen or fail to improve.    Thank you for the opportunity to care for this patient.  Please do not hesitate to contact  me with questions.  Gareth Morgan, FNP [facility]

## 2020-01-12 NOTE — Patient Instructions (Signed)
Asthma Begin montelukast 10 mg once a day at bedtime to prevent shortness of breath, cough, and wheeze Increase Flovent 110- 2 puffs twice a day with a spacer to prevent cough and wheeze. Rinse and spit after each use. ProAir 2 puffs every 4 hours as needed for cough and wheeze Use ProAir 2 puffs about 5-15 minutes before exercise to decrease cough and wheeze with activity   Allergic rhinitis/allergic conjunctivitis Continue Zyrtec 10 mg once a day as needed for runny nose or sneezing Consider nasal saline rinses before any medicated nasal sprays Begin Flonase nasal spray 2 sprays per nostril once a day as needed for a runny nose Some over the counter eye drops include Pataday one drop in each eye once a day as needed for red, itchy eyes OR Zaditor one drop in each eye twice a day as needed for red itchy eyes.  Reflux Begin famotidine 20 mg twice a day to help control reflux Begin dietary and lifestyle modifications as listed below  Call me if this treatment plan is not working for you  Follow up in 6 months or sooner if needed

## 2020-05-31 ENCOUNTER — Other Ambulatory Visit: Payer: Self-pay | Admitting: Family Medicine

## 2020-05-31 ENCOUNTER — Other Ambulatory Visit: Payer: Self-pay

## 2020-05-31 MED ORDER — ALBUTEROL SULFATE HFA 108 (90 BASE) MCG/ACT IN AERS: INHALATION_SPRAY | RESPIRATORY_TRACT | 1 refills | Status: DC

## 2020-07-04 ENCOUNTER — Other Ambulatory Visit: Payer: Self-pay

## 2020-07-04 ENCOUNTER — Emergency Department (HOSPITAL_BASED_OUTPATIENT_CLINIC_OR_DEPARTMENT_OTHER)
Admission: EM | Admit: 2020-07-04 | Discharge: 2020-07-04 | Disposition: A | Discharge status: Home / Self Care | Payer: Commercial Managed Care - PPO | Attending: Emergency Medicine | Admitting: Emergency Medicine

## 2020-07-04 ENCOUNTER — Encounter (HOSPITAL_BASED_OUTPATIENT_CLINIC_OR_DEPARTMENT_OTHER): Payer: Self-pay

## 2020-07-04 DIAGNOSIS — B029 Zoster without complications: Secondary | ICD-10-CM | POA: Insufficient documentation

## 2020-07-04 DIAGNOSIS — Z7951 Long term (current) use of inhaled steroids: Secondary | ICD-10-CM | POA: Diagnosis not present

## 2020-07-04 DIAGNOSIS — J4540 Moderate persistent asthma, uncomplicated: Secondary | ICD-10-CM | POA: Insufficient documentation

## 2020-07-04 DIAGNOSIS — Z9851 Tubal ligation status: Secondary | ICD-10-CM | POA: Diagnosis not present

## 2020-07-04 DIAGNOSIS — R21 Rash and other nonspecific skin eruption: Secondary | ICD-10-CM | POA: Diagnosis present

## 2020-07-04 MED ORDER — VALACYCLOVIR HCL 1 G PO TABS
1000.0000 mg | ORAL_TABLET | Freq: Three times a day (TID) | ORAL | 0 refills | Status: AC
Start: 2020-07-04 — End: 2020-07-14

## 2020-07-04 MED ORDER — PREDNISONE 10 MG PO TABS
20.0000 mg | ORAL_TABLET | Freq: Every day | ORAL | 0 refills | Status: AC
Start: 2020-07-04 — End: 2020-07-07

## 2020-07-04 NOTE — ED Triage Notes (Signed)
Pt arrives ambulatory to ED With c/o rash on left side of her body that started yesterday, states that it does not itch but is painful. Area is red and patchy on left back, under arm, and left breast.

## 2020-07-04 NOTE — Discharge Instructions (Addendum)
You have shingles.  This is as we discussed a virus that affects the nerves of your body. Please take the medications I prescribed you as prescribed. You may alternate between Tylenol and ibuprofen for pain.  It sounds like your pain has been relatively minimal so far.  As we discussed please stay away from pregnant women and immunocompromised people. As long as your vesicles are present you are highly contagious.  Please be cautious about touching her face eyes or other parts of your body after you touch the area of rash wash your hands well keep the area covered as long as you vesicles. I have printed your prescription for you because it is significantly cheaper at [employer]. I have printed coupons for this medicine.  Please use Tylenol or ibuprofen for pain.  You may use 600 mg ibuprofen every 6 hours or 1000 mg of Tylenol every 6 hours.  You may choose to alternate between the 2.  This would be most effective.  Not to exceed 4 g of Tylenol within 24 hours.  Not to exceed 3200 mg ibuprofen 24 hours.  If you develop itching you may use 25-50 mg of Benadryl as needed every 6 hours.  I have printed two work notes for you (use whichever is needed)

## 2020-07-04 NOTE — ED Provider Notes (Signed)
[facility] EMERGENCY DEPARTMENT Provider Note   CSN: [id number] Arrival date & time: 07/04/20  1036     History Chief Complaint  Patient presents with  . Rash    Sarah Drake is a 34 y.o. female.  HPI  Patient is a 34 year old female with a history of depression anemia and asthma presented today with 24 hours of left-sided burning stinging rash she states it is only mildly painful approximately 2/10 she states it is only on her left side of her back seems to spread over to the left side of her left breast.  She states she has had some paresthesias in the area as well with some sensation of itching that quickly resolves.  She denies any fevers, chills, headaches or other associated symptoms. States she feels well currently. Denies any aggravating or mitigating factors. She has tried no medications prior to arrival.     Past Medical History:  Diagnosis Date  . Anemia    low iron  . Asthma   . Complication of anesthesia    post op nausea and vomiting  . Depression    chronic depression    Patient Active Problem List   Diagnosis Date Noted  . Gastroesophageal reflux disease 01/12/2020  . Seasonal allergic conjunctivitis 10/24/2018  . Cholecystitis with cholelithiasis 07/09/2018  . Seasonal and perennial allergic rhinitis 11/01/2017  . Moderate persistent asthma without complication 05/31/2015  . Allergic rhinitis 05/31/2015    Past Surgical History:  Procedure Laterality Date  . BALLOON DILATION N/A 07/11/2018   Procedure: BALLOON DILATION;  Surgeon: Meridee Score Netty Starring., MD;  Location: [facility];  Service: Gastroenterology;  Laterality: N/A;  . BILIARY STENT PLACEMENT N/A 07/11/2018   Procedure: BILIARY STENT PLACEMENT;  Surgeon: Meridee Score Netty Starring., MD;  Location: [facility];  Service: Gastroenterology;  Laterality: N/A;  10x 7  . BIOPSY  07/11/2018   Procedure: BIOPSY;  Surgeon: Meridee Score Netty Starring., MD;  Location: [facility];  Service: Gastroenterology;;  . CESAREAN SECTION    . CHOLECYSTECTOMY N/A 07/10/2018   Procedure: LAPAROSCOPIC CHOLECYSTECTOMY WITH INTRAOPERATIVE CHOLANGIOGRAM, 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;  Surgeon: Ovidio Kin, MD;  Location: [facility];  Service: General;  Laterality: N/A;  . CHOLECYSTECTOMY    . ERCP N/A 07/11/2018   Procedure: ENDOSCOPIC RETROGRADE CHOLANGIOPANCREATOGRAPHY (ERCP);  Surgeon: Lemar Lofty., MD;  Location: [facility];  Service: Gastroenterology;  Laterality: N/A;  . ERCP N/A 10/05/2018   Procedure: ENDOSCOPIC RETROGRADE CHOLANGIOPANCREATOGRAPHY (ERCP);  Surgeon: Lemar Lofty., MD;  Location: [facility] ENDOSCOPY;  Service: Gastroenterology;  Laterality: N/A;  . PANCREATIC STENT PLACEMENT  07/11/2018   Procedure: PANCREATIC STENT PLACEMENT;  Surgeon: Meridee Score Netty Starring., MD;  Location: [facility];  Service: Gastroenterology;;  4x7  . REMOVAL OF STONES  07/11/2018   Procedure: REMOVAL OF STONES;  Surgeon: Meridee Score Netty Starring., MD;  Location: [facility];  Service: Gastroenterology;;  . REMOVAL OF STONES  10/05/2018   Procedure: REMOVAL OF STONES;  Surgeon: Lemar Lofty., MD;  Location: [facility] ENDOSCOPY;  Service: Gastroenterology;;  . Dennison Mascot  07/11/2018   Procedure: Dennison Mascot;  Surgeon: Lemar Lofty., MD;  Location: [facility];  Service: Gastroenterology;;  . Francine Graven REMOVAL  10/05/2018   Procedure: STENT REMOVAL;  Surgeon: Lemar Lofty., MD;  Location: [facility] ENDOSCOPY;  Service: Gastroenterology;;  . TUBAL LIGATION       OB History    Gravida  1   Para      Term  Preterm      AB      Living        SAB      TAB      Ectopic      Multiple      Live Births              Family History  Problem Relation Age of Onset  . Allergic rhinitis Mother   . Angioedema Neg Hx   . Asthma Neg Hx   . Eczema Neg Hx   . Immunodeficiency Neg Hx   . Urticaria Neg Hx      Social History   Tobacco Use  . Smoking status: Never Smoker  . Smokeless tobacco: Never Used  Vaping Use  . Vaping Use: Never used  Substance Use Topics  . Alcohol use: Yes    Comment: socail  . Drug use: Yes    Types: Marijuana    Comment: daily    Home Medications Prior to Admission medications   Medication Sig Start Date End Date Taking? Authorizing Provider  albuterol (PROAIR HFA) 108 (90 Base) MCG/ACT inhaler Inhale 2 puffs into the lungs every 4 (four) hours as needed for wheezing or shortness of breath. 10/24/18   Hetty Blend, FNP  albuterol (PROVENTIL) (2.5 MG/3ML) 0.083% nebulizer solution Take 3 mLs (2.5 mg total) by nebulization every 6 (six) hours as needed for wheezing or shortness of breath. 01/12/20   Ambs, Norvel Richards, FNP  albuterol (VENTOLIN HFA) 108 (90 Base) MCG/ACT inhaler INHALE 2 PUFFS BY MOUTH EVERY 4 HOURS AS NEEDED FOR WHEEZING FOR SHORTNESS OF BREATH 05/31/20   Ambs, Norvel Richards, FNP  diclofenac (VOLTAREN) 50 MG EC tablet Take 1 tablet (50 mg total) by mouth 2 (two) times daily. 12/28/19   Elson Areas, PA-C  famotidine (PEPCID) 20 MG tablet Take 1 tablet (20 mg total) by mouth 2 (two) times daily. 01/12/20   Hetty Blend, FNP  fluticasone (FLONASE) 50 MCG/ACT nasal spray Place 2 sprays into both nostrils daily as needed (for runny nose). 01/12/20   Hetty Blend, FNP  fluticasone (FLOVENT HFA) 110 MCG/ACT inhaler 2 puffs once a day with a spacer to prevent coughing and wheeze rinse and spit after use 01/12/20   Ambs, Norvel Richards, FNP  ibuprofen (ADVIL,MOTRIN) 200 MG tablet Take 1 tablet (200 mg total) by mouth every 6 (six) hours as needed for headache or moderate pain. 10/12/18   Mansouraty, Netty Starring., MD  montelukast (SINGULAIR) 10 MG tablet Take 1 tablet once a day at bedtime to prevent shortness of breath, coughing and wheeze 01/12/20   Hetty Blend, FNP  Olopatadine HCl 0.2 % SOLN 1 drop in each eye as needed for red itchy eyes Patient not taking: Reported on  01/12/2020 10/24/18   Hetty Blend, FNP  predniSONE (DELTASONE) 10 MG tablet Take 2 tablets (20 mg total) by mouth daily for 3 days. 07/04/20 07/07/20  Gailen Shelter, PA  valACYclovir (VALTREX) 1000 MG tablet Take 1 tablet (1,000 mg total) by mouth 3 (three) times daily for 10 days. 07/04/20 07/14/20  Gailen Shelter, PA    Allergies    Patient has no known allergies.  Review of Systems   Review of Systems  Constitutional: Negative for fever.  HENT: Negative for congestion.   Respiratory: Negative for shortness of breath.   Cardiovascular: Negative for chest pain.  Gastrointestinal: Negative for nausea.  Skin: Positive for rash.  Neurological: Negative for dizziness and  headaches.    Physical Exam Updated Vital Signs BP (!) 152/75 (BP Location: Right Arm)   Pulse 97   Temp 99.1 F (37.3 C) (Oral)   Resp 18   Ht 5\' 6"  (1.676 m)   Wt 108.9 kg   LMP 06/14/2020   SpO2 98%   BMI 38.74 kg/m   Physical Exam Vitals and nursing note reviewed.  Constitutional:      General: She is not in acute distress.    Appearance: Normal appearance. She is not ill-appearing.  HENT:     Head: Normocephalic and atraumatic.  Eyes:     General: No scleral icterus.       Right eye: No discharge.        Left eye: No discharge.     Conjunctiva/sclera: Conjunctivae normal.  Pulmonary:     Effort: Pulmonary effort is normal.     Breath sounds: No stridor.  Skin:    General: Skin is warm and dry.     Comments: Vesicular rash to the left back wrapping around to the left breast.   Neurological:     Mental Status: She is alert and oriented to person, place, and time. Mental status is at baseline.     ED Results / Procedures / Treatments   Labs (all labs ordered are listed, but only abnormal results are displayed) Labs Reviewed - No data to display  EKG None  Radiology No results found.  Procedures Procedures (including critical care time)  Medications Ordered in ED Medications -  No data to display  ED Course  I have reviewed the triage vital signs and the nursing notes.  Pertinent labs & imaging results that were available during my care of the patient were reviewed by me and considered in my medical decision making (see chart for details).    MDM Rules/Calculators/A&P                          Patient 34 year old female with classic herpetic vesicular rash located approximately the T4 dermatome  She is within the first 24 hours of symptoms and has vesicles present. Will treat with Valtrex and provide analgesia in the form of Tylenol, ibuprofen, prednisone and recommend Benadryl as needed for itching. She has no systemic symptoms as no immunosuppression and is well-appearing. Will discharge at this time with follow-up with her primary care doctor.  Educated on precautions to limit exposure to others.  She has no ocular or oral radicular symptoms. Doubt Ramsay Hunt.  Final Clinical Impression(s) / ED Diagnoses Final diagnoses:  Herpes zoster without complication    Rx / DC Orders ED Discharge Orders         Ordered    valACYclovir (VALTREX) 1000 MG tablet  3 times daily        07/04/20 1139    predniSONE (DELTASONE) 10 MG tablet  Daily        07/04/20 1139           07/06/20 [place], DOLE 07/04/20 1146    07/06/20, MD 07/04/20 1438

## 2020-10-22 ENCOUNTER — Emergency Department (HOSPITAL_BASED_OUTPATIENT_CLINIC_OR_DEPARTMENT_OTHER)
Admission: EM | Admit: 2020-10-22 | Discharge: 2020-10-22 | Disposition: A | Discharge status: Home / Self Care | Payer: Commercial Managed Care - PPO | Attending: Emergency Medicine | Admitting: Emergency Medicine

## 2020-10-22 ENCOUNTER — Emergency Department (HOSPITAL_BASED_OUTPATIENT_CLINIC_OR_DEPARTMENT_OTHER): Payer: Commercial Managed Care - PPO

## 2020-10-22 ENCOUNTER — Other Ambulatory Visit: Payer: Self-pay

## 2020-10-22 DIAGNOSIS — J45909 Unspecified asthma, uncomplicated: Secondary | ICD-10-CM | POA: Insufficient documentation

## 2020-10-22 DIAGNOSIS — R0789 Other chest pain: Secondary | ICD-10-CM | POA: Insufficient documentation

## 2020-10-22 DIAGNOSIS — R110 Nausea: Secondary | ICD-10-CM | POA: Insufficient documentation

## 2020-10-22 LAB — TROPONIN I (HIGH SENSITIVITY)
Troponin I (High Sensitivity): <2 ng/L (ref <18)
Troponin I (High Sensitivity): <2 ng/L (ref <18)

## 2020-10-22 LAB — BASIC METABOLIC PANEL
Anion gap: 9 (ref 5–15)
BUN: 9 mg/dL (ref 6–20)
CO2: 24 mmol/L (ref 22–32)
Calcium: 9.2 mg/dL (ref 8.9–10.3)
Chloride: 103 mmol/L (ref 98–111)
Creatinine, Ser: 0.51 mg/dL (ref 0.44–1.00)
GFR, Estimated: >60 mL/min (ref >60)
Glucose, Bld: 99 mg/dL (ref 70–99)
Potassium: 4.1 mmol/L (ref 3.5–5.1)
Sodium: 136 mmol/L (ref 135–145)

## 2020-10-22 LAB — CBC
HCT: 34.7 % — ABNORMAL LOW (ref 36.0–46.0)
Hemoglobin: 11.6 g/dL — ABNORMAL LOW (ref 12.0–15.0)
MCH: 31 pg (ref 26.0–34.0)
MCHC: 33.4 g/dL (ref 30.0–36.0)
MCV: 92.8 fL (ref 80.0–100.0)
Platelets: 245 10*3/uL (ref 150–400)
RBC: 3.74 MIL/uL — ABNORMAL LOW (ref 3.87–5.11)
RDW: 12.5 % (ref 11.5–15.5)
WBC: 3 10*3/uL — ABNORMAL LOW (ref 4.0–10.5)
nRBC: 0 % (ref 0.0–0.2)

## 2020-10-22 MED ORDER — NAPROXEN 500 MG PO TABS
500.0000 mg | ORAL_TABLET | Freq: Two times a day (BID) | ORAL | 0 refills | Status: DC
Start: 2020-10-22 — End: 2020-11-14

## 2020-10-22 NOTE — ED Provider Notes (Signed)
[facility] EMERGENCY DEPARTMENT Provider Note   CSN: [id number] Arrival date & time: 10/22/20  1443     History Chief Complaint  Patient presents with  . Chest Pain    Sarah Drake is a 35 y.o. female.  HPI  HPI: A 35 year old patient with a history of obesity presents for evaluation of chest pain. Initial onset of pain was more than 6 hours ago. The patient's chest pain is sharp and is not worse with exertion. The patient complains of nausea. The patient's chest pain is middle- or left-sided, is not well-localized, is not described as heaviness/pressure/tightness and does not radiate to the arms/jaw/neck. The patient denies diaphoresis. The patient has smoked in the past 90 days. The patient has no history of stroke, has no history of peripheral artery disease, denies any history of treated diabetes, has no relevant family history of coronary artery disease (first degree relative at less than age 37), is not hypertensive and has no history of hypercholesterolemia.  Patient states the symptoms have been ongoing for several days.  It is a sharp pain that increases with deep breathing and rolling over in certain positions.  She is not having any shortness of breath.  No fevers chills or cough. Past Medical History:  Diagnosis Date  . Anemia    low iron  . Asthma   . Complication of anesthesia    post op nausea and vomiting  . Depression    chronic depression    Patient Active Problem List   Diagnosis Date Noted  . Gastroesophageal reflux disease 01/12/2020  . Seasonal allergic conjunctivitis 10/24/2018  . Cholecystitis with cholelithiasis 07/09/2018  . Seasonal and perennial allergic rhinitis 11/01/2017  . Moderate persistent asthma without complication 05/31/2015  . Allergic rhinitis 05/31/2015    Past Surgical History:  Procedure Laterality Date  . BALLOON DILATION N/A 07/11/2018   Procedure: BALLOON DILATION;  Surgeon: Meridee Score Netty Starring., MD;   Location: [facility];  Service: Gastroenterology;  Laterality: N/A;  . BILIARY STENT PLACEMENT N/A 07/11/2018   Procedure: BILIARY STENT PLACEMENT;  Surgeon: Meridee Score Netty Starring., MD;  Location: [facility];  Service: Gastroenterology;  Laterality: N/A;  10x 7  . BIOPSY  07/11/2018   Procedure: BIOPSY;  Surgeon: Meridee Score Netty Starring., MD;  Location: [facility];  Service: Gastroenterology;;  . CESAREAN SECTION    . CHOLECYSTECTOMY N/A 07/10/2018   Procedure: LAPAROSCOPIC CHOLECYSTECTOMY WITH INTRAOPERATIVE CHOLANGIOGRAM, 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;  Surgeon: Ovidio Kin, MD;  Location: [facility];  Service: General;  Laterality: N/A;  . CHOLECYSTECTOMY    . ERCP N/A 07/11/2018   Procedure: ENDOSCOPIC RETROGRADE CHOLANGIOPANCREATOGRAPHY (ERCP);  Surgeon: Lemar Lofty., MD;  Location: [facility];  Service: Gastroenterology;  Laterality: N/A;  . ERCP N/A 10/05/2018   Procedure: ENDOSCOPIC RETROGRADE CHOLANGIOPANCREATOGRAPHY (ERCP);  Surgeon: Lemar Lofty., MD;  Location: [facility] ENDOSCOPY;  Service: Gastroenterology;  Laterality: N/A;  . PANCREATIC STENT PLACEMENT  07/11/2018   Procedure: PANCREATIC STENT PLACEMENT;  Surgeon: Meridee Score Netty Starring., MD;  Location: [facility];  Service: Gastroenterology;;  4x7  . REMOVAL OF STONES  07/11/2018   Procedure: REMOVAL OF STONES;  Surgeon: Meridee Score Netty Starring., MD;  Location: [facility];  Service: Gastroenterology;;  . REMOVAL OF STONES  10/05/2018   Procedure: REMOVAL OF STONES;  Surgeon: Lemar Lofty., MD;  Location: [facility] ENDOSCOPY;  Service: Gastroenterology;;  . Dennison Mascot  07/11/2018   Procedure: Dennison Mascot;  Surgeon: Lemar Lofty., MD;  Location: [facility];  Service:  Gastroenterology;;  . Francine Graven REMOVAL  10/05/2018   Procedure: STENT REMOVAL;  Surgeon: Lemar Lofty., MD;  Location: [facility] ENDOSCOPY;  Service: Gastroenterology;;  . TUBAL LIGATION       OB  History    Gravida  1   Para      Term      Preterm      AB      Living        SAB      IAB      Ectopic      Multiple      Live Births              Family History  Problem Relation Age of Onset  . Allergic rhinitis Mother   . Angioedema Neg Hx   . Asthma Neg Hx   . Eczema Neg Hx   . Immunodeficiency Neg Hx   . Urticaria Neg Hx     Social History   Tobacco Use  . Smoking status: Never Smoker  . Smokeless tobacco: Never Used  Vaping Use  . Vaping Use: Never used  Substance Use Topics  . Alcohol use: Yes    Comment: socail  . Drug use: Yes    Types: Marijuana    Comment: daily    Home Medications Prior to Admission medications   Medication Sig Start Date End Date Taking? Authorizing Provider  naproxen (NAPROSYN) 500 MG tablet Take 1 tablet (500 mg total) by mouth 2 (two) times daily with a meal. As needed for pain 10/22/20  Yes Linwood Dibbles, MD  albuterol (PROAIR HFA) 108 (90 Base) MCG/ACT inhaler Inhale 2 puffs into the lungs every 4 (four) hours as needed for wheezing or shortness of breath. 10/24/18   Hetty Blend, FNP  albuterol (PROVENTIL) (2.5 MG/3ML) 0.083% nebulizer solution Take 3 mLs (2.5 mg total) by nebulization every 6 (six) hours as needed for wheezing or shortness of breath. 01/12/20   Ambs, Norvel Richards, FNP  albuterol (VENTOLIN HFA) 108 (90 Base) MCG/ACT inhaler INHALE 2 PUFFS BY MOUTH EVERY 4 HOURS AS NEEDED FOR WHEEZING FOR SHORTNESS OF BREATH 05/31/20   Ambs, Norvel Richards, FNP  famotidine (PEPCID) 20 MG tablet Take 1 tablet (20 mg total) by mouth 2 (two) times daily. 01/12/20   Hetty Blend, FNP  fluticasone (FLONASE) 50 MCG/ACT nasal spray Place 2 sprays into both nostrils daily as needed (for runny nose). 01/12/20   Hetty Blend, FNP  fluticasone (FLOVENT HFA) 110 MCG/ACT inhaler 2 puffs once a day with a spacer to prevent coughing and wheeze rinse and spit after use 01/12/20   Ambs, Norvel Richards, FNP  montelukast (SINGULAIR) 10 MG tablet Take 1 tablet once a  day at bedtime to prevent shortness of breath, coughing and wheeze 01/12/20   Hetty Blend, FNP  Olopatadine HCl 0.2 % SOLN 1 drop in each eye as needed for red itchy eyes Patient not taking: Reported on 01/12/2020 10/24/18   Hetty Blend, FNP    Allergies    Patient has no known allergies.  Review of Systems   Review of Systems  All other systems reviewed and are negative.   Physical Exam Updated Vital Signs BP 121/78 (BP Location: Right Arm)   Pulse 86   Temp 97.9 F (36.6 C) (Oral)   Resp 18   Ht 1.651 m (5\' 5" )   Wt 104.3 kg   LMP 10/07/2020 (Approximate)   SpO2 97%   BMI 38.27 kg/m  Physical Exam Vitals and nursing note reviewed.  Constitutional:      General: She is not in acute distress.    Appearance: She is well-developed and well-nourished.  HENT:     Head: Normocephalic and atraumatic.     Right Ear: External ear normal.     Left Ear: External ear normal.  Eyes:     General: No scleral icterus.       Right eye: No discharge.        Left eye: No discharge.     Conjunctiva/sclera: Conjunctivae normal.  Neck:     Trachea: No tracheal deviation.  Cardiovascular:     Rate and Rhythm: Normal rate and regular rhythm.     Pulses: Intact distal pulses.  Pulmonary:     Effort: Pulmonary effort is normal. No respiratory distress.     Breath sounds: Normal breath sounds. No stridor. No wheezing or rales.  Chest:     Chest wall: Tenderness present. No deformity or swelling.     Comments: Significant tenderness to palpation of the chest wall, reproduces the patient's symptoms Abdominal:     General: Bowel sounds are normal. There is no distension.     Palpations: Abdomen is soft.     Tenderness: There is no abdominal tenderness. There is no guarding or rebound.  Musculoskeletal:        General: No tenderness or edema.     Cervical back: Neck supple.  Skin:    General: Skin is warm and dry.     Findings: No rash.  Neurological:     Mental Status: She is alert.      Cranial Nerves: No cranial nerve deficit (no facial droop, extraocular movements intact, no slurred speech).     Sensory: No sensory deficit.     Motor: No abnormal muscle tone or seizure activity.     Coordination: Coordination normal.     Deep Tendon Reflexes: Strength normal.  Psychiatric:        Mood and Affect: Mood and affect normal.     ED Results / Procedures / Treatments   Labs (all labs ordered are listed, but only abnormal results are displayed) Labs Reviewed  CBC - Abnormal; Notable for the following components:      Result Value   WBC 3.0 (*)    RBC 3.74 (*)    Hemoglobin 11.6 (*)    HCT 34.7 (*)    All other components within normal limits  BASIC METABOLIC PANEL  TROPONIN I (HIGH SENSITIVITY)  TROPONIN I (HIGH SENSITIVITY)    EKG EKG Interpretation  Date/Time:  Tuesday October 22 2020 06:36:09 EST Ventricular Rate:  82 PR Interval:    QRS Duration: 95 QT Interval:  374 QTC Calculation: 437 R Axis:   73 Text Interpretation: Sinus rhythm Rate is slower Since last tracing rate slower Reconfirmed by Linwood Dibbles [phone]) on 10/22/2020 6:58:17 AM   Radiology DG Chest 2 View  Result Date: 10/22/2020 CLINICAL DATA:  Chest pain. EXAM: CHEST - 2 VIEW COMPARISON:  09/20/2016. FINDINGS: Mediastinum and hilar structures normal. Lungs are clear. No focal infiltrate. No pleural effusion or pneumothorax. Heart size normal. IMPRESSION: No acute cardiopulmonary disease. Electronically Signed   By: Maisie Fus  Register   On: 10/22/2020 07:38    Procedures Procedures   Medications Ordered in ED Medications - No data to display  ED Course  I have reviewed the triage vital signs and the nursing notes.  Pertinent labs & imaging results that were available  during my care of the patient were reviewed by me and considered in my medical decision making (see chart for details).  Clinical Course as of 10/22/20 1030  Tue Oct 22, 2020  [id number] Initial labs reviewed.  CBC metabolic  panel and troponin are normal. [JK]  0925 Chest x-ray without acute findings [JK]    Clinical Course User Index [JK] Linwood DibblesKnapp, Jon, MD   MDM Rules/Calculators/A&P HEAR Score: 1                        Patient presented to the ED for evaluation of chest pain.  Patient symptoms atypical for acute coronary syndrome.  Heart pathway followed.  Serial troponins normal.  Doubt ACS.  Patient is not having any shortness of breath.  PERC negative.  No PE.  Patient does have reproducible tenderness to palpation on her chest wall.  I suspect costochondritis.  Will discharge home with NSAIDs.  Discussed outpatient follow-up. Final Clinical Impression(s) / ED Diagnoses Final diagnoses:  Chest wall pain    Rx / DC Orders ED Discharge Orders         Ordered    naproxen (NAPROSYN) 500 MG tablet  2 times daily with meals        10/22/20 1027           Linwood DibblesKnapp, Jon, MD 10/22/20 1031

## 2020-10-22 NOTE — ED Notes (Signed)
Pt ambulatory with a slow steady gait to restroom

## 2020-10-22 NOTE — Discharge Instructions (Addendum)
Take the medications as needed for pain.  Follow-up with your primary care doctor to be rechecked if the symptoms persist

## 2020-10-22 NOTE — ED Triage Notes (Signed)
Patient arrived via POV c/o right sided chest pain x 3 days. Patient denies increased pain with deep breath. Patient states it's worse with movement. 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 Patient is AO x 4, VS WDL, normal gait.

## 2020-10-22 NOTE — ED Notes (Signed)
Patient transported to X-ray 

## 2020-10-24 ENCOUNTER — Encounter (HOSPITAL_BASED_OUTPATIENT_CLINIC_OR_DEPARTMENT_OTHER): Payer: Self-pay

## 2020-10-24 ENCOUNTER — Emergency Department (HOSPITAL_BASED_OUTPATIENT_CLINIC_OR_DEPARTMENT_OTHER)
Admission: EM | Admit: 2020-10-24 | Discharge: 2020-10-24 | Disposition: A | Discharge status: Home / Self Care | Payer: Commercial Managed Care - PPO | Attending: Emergency Medicine | Admitting: Emergency Medicine

## 2020-10-24 ENCOUNTER — Emergency Department (HOSPITAL_BASED_OUTPATIENT_CLINIC_OR_DEPARTMENT_OTHER): Payer: Commercial Managed Care - PPO

## 2020-10-24 ENCOUNTER — Other Ambulatory Visit: Payer: Self-pay

## 2020-10-24 DIAGNOSIS — Z7951 Long term (current) use of inhaled steroids: Secondary | ICD-10-CM | POA: Insufficient documentation

## 2020-10-24 DIAGNOSIS — J4540 Moderate persistent asthma, uncomplicated: Secondary | ICD-10-CM | POA: Diagnosis not present

## 2020-10-24 DIAGNOSIS — R109 Unspecified abdominal pain: Secondary | ICD-10-CM

## 2020-10-24 DIAGNOSIS — R112 Nausea with vomiting, unspecified: Secondary | ICD-10-CM | POA: Diagnosis not present

## 2020-10-24 DIAGNOSIS — R1030 Lower abdominal pain, unspecified: Secondary | ICD-10-CM | POA: Diagnosis not present

## 2020-10-24 DIAGNOSIS — R1084 Generalized abdominal pain: Secondary | ICD-10-CM | POA: Insufficient documentation

## 2020-10-24 DIAGNOSIS — K219 Gastro-esophageal reflux disease without esophagitis: Secondary | ICD-10-CM | POA: Diagnosis not present

## 2020-10-24 DIAGNOSIS — R197 Diarrhea, unspecified: Secondary | ICD-10-CM | POA: Insufficient documentation

## 2020-10-24 LAB — CBC WITH DIFFERENTIAL/PLATELET
Abs Immature Granulocytes: 0.01 10*3/uL (ref 0.00–0.07)
Basophils Absolute: 0 10*3/uL (ref 0.0–0.1)
Basophils Relative: 1 %
Eosinophils Absolute: 0.2 10*3/uL (ref 0.0–0.5)
Eosinophils Relative: 5 %
HCT: 34.9 % — ABNORMAL LOW (ref 36.0–46.0)
Hemoglobin: 11.7 g/dL — ABNORMAL LOW (ref 12.0–15.0)
Immature Granulocytes: 0 %
Lymphocytes Relative: 44 %
Lymphs Abs: 1.9 10*3/uL (ref 0.7–4.0)
MCH: 31 pg (ref 26.0–34.0)
MCHC: 33.5 g/dL (ref 30.0–36.0)
MCV: 92.3 fL (ref 80.0–100.0)
Monocytes Absolute: 0.3 10*3/uL (ref 0.1–1.0)
Monocytes Relative: 8 %
Neutro Abs: 1.8 10*3/uL (ref 1.7–7.7)
Neutrophils Relative %: 42 %
Platelets: 250 10*3/uL (ref 150–400)
RBC: 3.78 MIL/uL — ABNORMAL LOW (ref 3.87–5.11)
RDW: 12.4 % (ref 11.5–15.5)
WBC: 4.3 10*3/uL (ref 4.0–10.5)
nRBC: 0 % (ref 0.0–0.2)

## 2020-10-24 LAB — URINALYSIS, ROUTINE W REFLEX MICROSCOPIC
Bilirubin Urine: NEGATIVE
Glucose, UA: NEGATIVE mg/dL
Ketones, ur: NEGATIVE mg/dL
Leukocytes,Ua: NEGATIVE
Nitrite: NEGATIVE
Protein, ur: NEGATIVE mg/dL
Specific Gravity, Urine: 1.02 (ref 1.005–1.030)
pH: 6 (ref 5.0–8.0)

## 2020-10-24 LAB — COMPREHENSIVE METABOLIC PANEL
ALT: 11 U/L (ref 0–44)
AST: 14 U/L — ABNORMAL LOW (ref 15–41)
Albumin: 4.1 g/dL (ref 3.5–5.0)
Alkaline Phosphatase: 43 U/L (ref 38–126)
Anion gap: 6 (ref 5–15)
BUN: 8 mg/dL (ref 6–20)
CO2: 26 mmol/L (ref 22–32)
Calcium: 9.4 mg/dL (ref 8.9–10.3)
Chloride: 105 mmol/L (ref 98–111)
Creatinine, Ser: 0.62 mg/dL (ref 0.44–1.00)
GFR, Estimated: >60 mL/min (ref >60)
Glucose, Bld: 114 mg/dL — ABNORMAL HIGH (ref 70–99)
Potassium: 4 mmol/L (ref 3.5–5.1)
Sodium: 137 mmol/L (ref 135–145)
Total Bilirubin: 0.3 mg/dL (ref 0.3–1.2)
Total Protein: 7 g/dL (ref 6.5–8.1)

## 2020-10-24 LAB — URINALYSIS, MICROSCOPIC (REFLEX)

## 2020-10-24 LAB — LIPASE, BLOOD: Lipase: 27 U/L (ref 11–51)

## 2020-10-24 LAB — PREGNANCY, URINE: Preg Test, Ur: NEGATIVE

## 2020-10-24 MED ORDER — ONDANSETRON 4 MG PO TBDP
4.0000 mg | ORAL_TABLET | Freq: Three times a day (TID) | ORAL | 0 refills | Status: DC | PRN
Start: 2020-10-24 — End: 2020-11-14

## 2020-10-24 MED ORDER — ONDANSETRON HCL 4 MG/2ML IJ SOLN
4.0000 mg | Freq: Once | INTRAMUSCULAR | Status: AC
  Administered 2020-10-24: 4 mg via INTRAVENOUS
  Filled 2020-10-24: qty 2

## 2020-10-24 MED ORDER — FENTANYL CITRATE (PF) 100 MCG/2ML IJ SOLN
50.0000 ug | Freq: Once | INTRAMUSCULAR | Status: AC
  Administered 2020-10-24: 50 ug via INTRAVENOUS
  Filled 2020-10-24: qty 2

## 2020-10-24 NOTE — ED Provider Notes (Signed)
[facility] EMERGENCY DEPARTMENT Provider Note   CSN: [id number] Arrival date & time: 10/24/20  0754     History Chief Complaint  Patient presents with  . Abdominal Pain    Sarah Drake is a 35 y.o. female.   Abdominal Pain Associated symptoms: nausea and vomiting   Associated symptoms: no shortness of breath    Patient presents with abdominal pain.  Somewhat diffuse.  Began last night.  States she had some diffuse abdominal pain and crampiness.  States she had diarrhea.  Also had some nausea and then vomiting.  States she vomited up yellow fluid.  Pain is dull.  Somewhat cramping.  Previous gallbladder surgery.  Was seen in the ER 2 days ago for chest pain.  States that feels better and she was not hurting in this area at that time.  No fevers or chills.  Past Medical History:  Diagnosis Date  . Anemia    low iron  . Asthma   . Complication of anesthesia    post op nausea and vomiting  . Depression    chronic depression    Patient Active Problem List   Diagnosis Date Noted  . Gastroesophageal reflux disease 01/12/2020  . Seasonal allergic conjunctivitis 10/24/2018  . Cholecystitis with cholelithiasis 07/09/2018  . Seasonal and perennial allergic rhinitis 11/01/2017  . Moderate persistent asthma without complication 05/31/2015  . Allergic rhinitis 05/31/2015    Past Surgical History:  Procedure Laterality Date  . BALLOON DILATION N/A 07/11/2018   Procedure: BALLOON DILATION;  Surgeon: Meridee Score Netty Starring., MD;  Location: [facility];  Service: Gastroenterology;  Laterality: N/A;  . BILIARY STENT PLACEMENT N/A 07/11/2018   Procedure: BILIARY STENT PLACEMENT;  Surgeon: Meridee Score Netty Starring., MD;  Location: [facility];  Service: Gastroenterology;  Laterality: N/A;  10x 7  . BIOPSY  07/11/2018   Procedure: BIOPSY;  Surgeon: Meridee Score Netty Starring., MD;  Location: [facility];  Service: Gastroenterology;;  . CESAREAN SECTION    .  CHOLECYSTECTOMY N/A 07/10/2018   Procedure: LAPAROSCOPIC CHOLECYSTECTOMY WITH INTRAOPERATIVE CHOLANGIOGRAM, 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;  Surgeon: Ovidio Kin, MD;  Location: [facility];  Service: General;  Laterality: N/A;  . CHOLECYSTECTOMY    . ERCP N/A 07/11/2018   Procedure: ENDOSCOPIC RETROGRADE CHOLANGIOPANCREATOGRAPHY (ERCP);  Surgeon: Lemar Lofty., MD;  Location: [facility];  Service: Gastroenterology;  Laterality: N/A;  . ERCP N/A 10/05/2018   Procedure: ENDOSCOPIC RETROGRADE CHOLANGIOPANCREATOGRAPHY (ERCP);  Surgeon: Lemar Lofty., MD;  Location: [facility] ENDOSCOPY;  Service: Gastroenterology;  Laterality: N/A;  . PANCREATIC STENT PLACEMENT  07/11/2018   Procedure: PANCREATIC STENT PLACEMENT;  Surgeon: Meridee Score Netty Starring., MD;  Location: [facility];  Service: Gastroenterology;;  4x7  . REMOVAL OF STONES  07/11/2018   Procedure: REMOVAL OF STONES;  Surgeon: Meridee Score Netty Starring., MD;  Location: [facility];  Service: Gastroenterology;;  . REMOVAL OF STONES  10/05/2018   Procedure: REMOVAL OF STONES;  Surgeon: Lemar Lofty., MD;  Location: [facility] ENDOSCOPY;  Service: Gastroenterology;;  . Dennison Mascot  07/11/2018   Procedure: Dennison Mascot;  Surgeon: Lemar Lofty., MD;  Location: [facility];  Service: Gastroenterology;;  . Francine Graven REMOVAL  10/05/2018   Procedure: STENT REMOVAL;  Surgeon: Lemar Lofty., MD;  Location: [facility]COPY;  Service: Gastroenterology;;  . TUBAL LIGATION       OB History    Gravida  1   Para      Term      Preterm  AB      Living        SAB      IAB      Ectopic      Multiple      Live Births              Family History  Problem Relation Age of Onset  . Allergic rhinitis Mother   . Angioedema Neg Hx   . Asthma Neg Hx   . Eczema Neg Hx   . Immunodeficiency Neg Hx   . Urticaria Neg Hx     Social History   Tobacco Use  . Smoking status: Never Smoker   . Smokeless tobacco: Never Used  Vaping Use  . Vaping Use: Never used  Substance Use Topics  . Alcohol use: Yes    Comment: socail  . Drug use: Yes    Types: Marijuana    Comment: daily    Home Medications Prior to Admission medications   Medication Sig Start Date End Date Taking? Authorizing Provider  fluticasone (FLONASE) 50 MCG/ACT nasal spray Place 2 sprays into both nostrils daily as needed (for runny nose). 01/12/20  Yes Ambs, Norvel Richards, FNP  fluticasone (FLOVENT HFA) 110 MCG/ACT inhaler 2 puffs once a day with a spacer to prevent coughing and wheeze rinse and spit after use 01/12/20  Yes Ambs, Norvel Richards, FNP  montelukast (SINGULAIR) 10 MG tablet Take 1 tablet once a day at bedtime to prevent shortness of breath, coughing and wheeze 01/12/20  Yes Ambs, Norvel Richards, FNP  ondansetron (ZOFRAN-ODT) 4 MG disintegrating tablet Take 1 tablet (4 mg total) by mouth every 8 (eight) hours as needed for nausea or vomiting. 10/24/20  Yes Benjiman Core, MD  albuterol (PROAIR HFA) 108 (90 Base) MCG/ACT inhaler Inhale 2 puffs into the lungs every 4 (four) hours as needed for wheezing or shortness of breath. 10/24/18   Hetty Blend, FNP  albuterol (PROVENTIL) (2.5 MG/3ML) 0.083% nebulizer solution Take 3 mLs (2.5 mg total) by nebulization every 6 (six) hours as needed for wheezing or shortness of breath. 01/12/20   Ambs, Norvel Richards, FNP  albuterol (VENTOLIN HFA) 108 (90 Base) MCG/ACT inhaler INHALE 2 PUFFS BY MOUTH EVERY 4 HOURS AS NEEDED FOR WHEEZING FOR SHORTNESS OF BREATH 05/31/20   Ambs, Norvel Richards, FNP  famotidine (PEPCID) 20 MG tablet Take 1 tablet (20 mg total) by mouth 2 (two) times daily. 01/12/20   Hetty Blend, FNP  naproxen (NAPROSYN) 500 MG tablet Take 1 tablet (500 mg total) by mouth 2 (two) times daily with a meal. As needed for pain 10/22/20   Linwood Dibbles, MD  Olopatadine HCl 0.2 % SOLN 1 drop in each eye as needed for red itchy eyes Patient not taking: No sig reported 10/24/18   Hetty Blend, FNP     Allergies    Patient has no known allergies.  Review of Systems   Review of Systems  Constitutional: Negative for appetite change.  HENT: Negative for dental problem.   Respiratory: Negative for shortness of breath.   Gastrointestinal: Positive for abdominal pain, nausea and vomiting.  Genitourinary: Negative for dyspareunia.  Musculoskeletal: Negative for back pain.  Skin: Negative for rash.  Neurological: Negative for weakness.  Psychiatric/Behavioral: Negative for confusion.    Physical Exam Updated Vital Signs BP 118/64   Pulse 78   Temp 98 F (36.7 C)   Resp 16   Ht 5\' 5"  (1.651 m)   Wt 104.3 kg   LMP  10/07/2020 (Approximate)   SpO2 99%   BMI 38.27 kg/m   Physical Exam Vitals and nursing note reviewed.  HENT:     Head: Normocephalic.  Cardiovascular:     Rate and Rhythm: Normal rate and regular rhythm.  Abdominal:     Tenderness: There is abdominal tenderness.     Hernia: No hernia is present.     Comments: Mild abdominal tenderness.  No distention.  No rebound or guarding.  Skin:    General: Skin is warm.     Capillary Refill: Capillary refill takes less than 2 seconds.  Neurological:     Mental Status: She is alert and oriented to person, place, and time.     ED Results / Procedures / Treatments   Labs (all labs ordered are listed, but only abnormal results are displayed) Labs Reviewed  URINALYSIS, ROUTINE W REFLEX MICROSCOPIC - Abnormal; Notable for the following components:      Result Value   Color, Urine STRAW (*)    Hgb urine dipstick TRACE (*)    All other components within normal limits  URINALYSIS, MICROSCOPIC (REFLEX) - Abnormal; Notable for the following components:   Bacteria, UA FEW (*)    All other components within normal limits  CBC WITH DIFFERENTIAL/PLATELET - Abnormal; Notable for the following components:   RBC 3.78 (*)    Hemoglobin 11.7 (*)    HCT 34.9 (*)    All other components within normal limits  COMPREHENSIVE  METABOLIC PANEL - Abnormal; Notable for the following components:   Glucose, Bld 114 (*)    AST 14 (*)    All other components within normal limits  PREGNANCY, URINE  LIPASE, BLOOD  CBC WITH DIFFERENTIAL/PLATELET    EKG None  Radiology DG Abd 2 Views  Result Date: 10/24/2020 CLINICAL DATA:  35 year old female with abdominal pain, burning. Vomiting. EXAM: ABDOMEN - 2 VIEW COMPARISON:  Lumbar radiographs 08/09/2018. FINDINGS: Upright and supine views. Negative visible lung bases. No pneumoperitoneum. Stable cholecystectomy clips. Plastic CBD stent in 2019 has been removed. Non obstructed bowel gas pattern. Abdominal and pelvic visceral contours are within normal limits; small chronic pelvic phleboliths. Levoconvex lumbar scoliosis is increased. No acute osseous abnormality identified. IMPRESSION: 1.  Non obstructed bowel gas pattern, no free air. 2. Prior cholecystectomy.  Removed CBD stent since 2019. Electronically Signed   By: Odessa Fleming M.D.   On: 10/24/2020 10:31    Procedures Procedures   Medications Ordered in ED Medications  ondansetron (ZOFRAN) injection 4 mg (4 mg Intravenous Given 10/24/20 0850)  fentaNYL (SUBLIMAZE) injection 50 mcg (50 mcg Intravenous Given 10/24/20 4650)    ED Course  I have reviewed the triage vital signs and the nursing notes.  Pertinent labs & imaging results that were available during my care of the patient were reviewed by me and considered in my medical decision making (see chart for details).    MDM Rules/Calculators/A&P                          Patient with lower abdominal pain.  Dull.  Has had some vomiting and some diarrhea.  Seen in the ER recently for chest pain but was not having pain like this at the time.  Work-up reassuring.  X-ray reassuring.  Mild tenderness.  Discussed with patient likely gastroenteritis but discussed possible CT scan for continued pain.  At this point both patient and I feel it is reasonable go home and states that she  has to go for some family event.  Discharge home with outpatient follow-up as needed.  Doubt diverticulitis or appendicitis. Final Clinical Impression(s) / ED Diagnoses Final diagnoses:  Abdominal pain  Nausea vomiting and diarrhea  Abdominal pain, unspecified abdominal location    Rx / DC Orders ED Discharge Orders         Ordered    ondansetron (ZOFRAN-ODT) 4 MG disintegrating tablet  Every 8 hours PRN        10/24/20 1225           Benjiman CorePickering, Khalaya Mcgurn, MD 10/24/20 1457

## 2020-10-24 NOTE — ED Triage Notes (Signed)
Pt states had several bowel movements overnight, vomited x1 bile appearing fluid. Reports burning sensation mid abdomen with cramping.  Hx gall bladder surgery 2 years ago.  Denies GERD

## 2020-11-13 ENCOUNTER — Other Ambulatory Visit: Payer: Self-pay | Admitting: Family Medicine

## 2020-11-14 ENCOUNTER — Encounter: Payer: Self-pay | Admitting: Allergy & Immunology

## 2020-11-14 ENCOUNTER — Ambulatory Visit (INDEPENDENT_AMBULATORY_CARE_PROVIDER_SITE_OTHER): Payer: Commercial Managed Care - PPO | Admitting: Allergy & Immunology

## 2020-11-14 ENCOUNTER — Other Ambulatory Visit: Payer: Self-pay

## 2020-11-14 VITALS — BP 118/72 | HR 90 | Temp 98.5°F | Resp 16 | Ht 66.0 in

## 2020-11-14 DIAGNOSIS — J4541 Moderate persistent asthma with (acute) exacerbation: Secondary | ICD-10-CM

## 2020-11-14 DIAGNOSIS — J302 Other seasonal allergic rhinitis: Secondary | ICD-10-CM

## 2020-11-14 DIAGNOSIS — J3089 Other allergic rhinitis: Secondary | ICD-10-CM

## 2020-11-14 DIAGNOSIS — K219 Gastro-esophageal reflux disease without esophagitis: Secondary | ICD-10-CM

## 2020-11-14 DIAGNOSIS — J4540 Moderate persistent asthma, uncomplicated: Secondary | ICD-10-CM

## 2020-11-14 MED ORDER — ALBUTEROL SULFATE (2.5 MG/3ML) 0.083% IN NEBU
2.5000 mg | INHALATION_SOLUTION | Freq: Four times a day (QID) | RESPIRATORY_TRACT | 2 refills | Status: DC | PRN
Start: 2020-11-14 — End: 2022-10-26

## 2020-11-14 MED ORDER — CETIRIZINE HCL 10 MG PO TABS: 10.0000 mg | ORAL_TABLET | Freq: Every day | ORAL | 11 refills | Status: DC | PRN

## 2020-11-14 MED ORDER — FLOVENT HFA 110 MCG/ACT IN AERO: INHALATION_SPRAY | RESPIRATORY_TRACT | 11 refills | Status: DC

## 2020-11-14 MED ORDER — ALBUTEROL SULFATE HFA 108 (90 BASE) MCG/ACT IN AERS: 2.0000 | INHALATION_SPRAY | RESPIRATORY_TRACT | 3 refills | Status: DC | PRN

## 2020-11-14 NOTE — Progress Notes (Signed)
FOLLOW UP  Date of Service/Encounter:  11/14/20   Assessment:   Moderate persistent asthma, uncomplicated  Seasonal and perennial allergic rhinitis  Gastroesophageal reflux disease   Sarah Drake is doing very well from an asthma and allergy perspective.  We are going to continue with the current regimen.  She has literally not used her albuterol inhaler extensively in 2 years and she has not required prednisone in that time either.  We will continue with Flovent 2 puffs once daily, increasing to twice daily during flares.  I think she might benefit from a prescription reflux medication, but she prefers to avoid medications if possible.  We are seeing her again in 1 year or earlier if needed.  Plan/Recommendations:   1. Moderate persistent asthma, uncomplicated - Lung testing looks stable today. - We are going to continue with the same medications since this seems to be working so well.  - Spacer use reviewed. - Daily controller medication(s): Flovent 2 puffs once daily with spacer - Prior to physical activity: albuterol 2 puffs 10-15 minutes before physical activity. - Rescue medications: albuterol 4 puffs every 4-6 hours as needed - Changes during respiratory infections or worsening symptoms: Increase Flovent to 4 puffs twice daily for TWO WEEKS. - Asthma control goals:  * Full participation in all desired activities (may need albuterol before activity) * Albuterol use two time or less a week on average (not counting use with activity) * Cough interfering with sleep two time or less a month * Oral steroids no more than once a year * No hospitalizations  2. Seasonal and perennial allergic rhinitis -Continue with the use of cetirizine 10mg  daily. - You can use it twice daily on bad days if needed.   3. Gastroesophageal reflux disease - Continue with dietary modifications.  4. Return in about 1 year (around 11/14/2021).   Subjective:   Sarah Drake is a  35 y.o. female presenting today for follow up of  Chief Complaint  Patient presents with  . Asthma    Sarah Drake has a history of the following: Patient Active Problem List   Diagnosis Date Noted  . Gastroesophageal reflux disease 01/12/2020  . Seasonal allergic conjunctivitis 10/24/2018  . Cholecystitis with cholelithiasis 07/09/2018  . Seasonal and perennial allergic rhinitis 11/01/2017  . Moderate persistent asthma without complication 05/31/2015  . Allergic rhinitis 05/31/2015    History obtained from: chart review and patient.  Sarah Drake is a 35 y.o. female presenting for a follow up visit.  She was last seen in May 2021.  At that time, her symptoms seem to be well controlled.  She was continued on Flovent but was increased to 2 puffs twice a day with a spacer.  She was also started on Singulair 10 mg daily and continued on ProAir as needed.  For her allergic rhinitis, she was continued on Zyrtec and Flonase was added.  For her reflux, she was started on famotidine 20 mg twice daily and dietary and lifestyle modifications were recommended.  Since last visit, she has done well.   Asthma/Respiratory Symptom History: She does her Flovent two puffs in the morning. 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 She does this 6 days per week. She does not like her job and she is planning on going back to school. She is looking at dental and June 2021. 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 This  is something that she has done since that time, although the companies changed.   Allergic Rhinitis Symptom History: She has rhinorrhea fairly frequently but she does not really feel anything until the summer or the spring. She reports some stuffiness especially when her eyes are itching. [place] is definitely the worst time of the year. She has not needed antibiotics at all since the last visit. She has been seeing [place] for two years in  total.   GERD Symptom History: She mostly treats her reflux with ingestion of cow's milk to counteract things. She never did get the famotidine. Her symptoms all depend on diet. She know that pizza will definitely flair these symptoms.    She did have shingles last year. Otherwise there have been no rashes. She has had no other infectious problems at all.   She is vaccinated, but she has not gotten the booster. She had the first two Pfizer vaccinations. She is afraid of needles and does not really want to get a booster today at least. This is despite the fact that she has a lot of tattoos.   She has a 35yo, 35yo, and an 35yo. She really is wanting to change her career, although she is not sure that she will make the same amount of money in a different job.    Otherwise, there have been no changes to her past medical history, surgical history, family history, or social history.    Review of Systems  Constitutional: Negative.  Negative for fever, malaise/fatigue and weight loss.  HENT: Negative.  Negative for congestion, ear discharge and ear pain.   Eyes: Negative for pain, discharge and redness.  Respiratory: Negative for cough, sputum production, shortness of breath and wheezing.   Cardiovascular: Negative.  Negative for chest pain and palpitations.  Gastrointestinal: Negative for abdominal pain and heartburn.  Skin: Negative.  Negative for itching and rash.  Neurological: Negative for dizziness and headaches.  Endo/Heme/Allergies: Negative for environmental allergies. Does not bruise/bleed easily.       Objective:   Blood pressure 118/72, pulse 90, temperature 98.5 F (36.9 C), temperature source Temporal, resp. rate 16, height 5\' 6"  (1.676 m), SpO2 98 %. Body mass index is 37.12 kg/m.   Physical Exam:  Physical Exam Constitutional:      Appearance: She is well-developed.     Comments: Very pleasant talkative female.  HENT:     Head: Normocephalic and atraumatic.      Right Ear: Tympanic membrane, ear canal and external ear normal.     Left Ear: Tympanic membrane, ear canal and external ear normal.     Nose: Mucosal edema present. No nasal deformity, septal deviation or rhinorrhea.     Right Turbinates: Enlarged and swollen.     Left Turbinates: Enlarged and swollen.     Right Sinus: No maxillary sinus tenderness or frontal sinus tenderness.     Left Sinus: No maxillary sinus tenderness or frontal sinus tenderness.     Mouth/Throat:     Mouth: Mucous membranes are not pale and not dry.     Pharynx: Uvula midline.  Eyes:     General: Lids are normal.        Right eye: No discharge.        Left eye: No discharge.     Conjunctiva/sclera: Conjunctivae normal.     Right eye: Right conjunctiva is not injected. No chemosis.    Left eye: Left conjunctiva is not injected. No chemosis.    Pupils: Pupils  are equal, round, and reactive to light.  Cardiovascular:     Rate and Rhythm: Normal rate and regular rhythm.     Heart sounds: Normal heart sounds.  Pulmonary:     Effort: Pulmonary effort is normal. No tachypnea, accessory muscle usage or respiratory distress.     Breath sounds: Normal breath sounds. No wheezing, rhonchi or rales.     Comments: Moving air well in all lung fields. No wheezing or crackles noted.  Chest:     Chest wall: No tenderness.  Lymphadenopathy:     Cervical: No cervical adenopathy.  Skin:    Coloration: Skin is not pale.     Findings: No abrasion, erythema, petechiae or rash. Rash is not papular, urticarial or vesicular.  Neurological:     Mental Status: She is alert.  Psychiatric:        Behavior: Behavior is cooperative.      Diagnostic studies:    Spirometry: results normal (FEV1: 3.25/114%, FVC: 3.95/116%, FEV1/FVC: 82%).    Spirometry consistent with normal pattern.   Allergy Studies: none        Malachi Bonds, MD  [facility]

## 2020-11-14 NOTE — Patient Instructions (Addendum)
1. Moderate persistent asthma, uncomplicated - Lung testing looks stable today. - We are going to continue with the same medications since this seems to be working so well.  - Spacer use reviewed. - Daily controller medication(s): Flovent 2 puffs once daily with spacer - Prior to physical activity: albuterol 2 puffs 10-15 minutes before physical activity. - Rescue medications: albuterol 4 puffs every 4-6 hours as needed - Changes during respiratory infections or worsening symptoms: Increase Flovent to 4 puffs twice daily for TWO WEEKS. - Asthma control goals:  * Full participation in all desired activities (may need albuterol before activity) * Albuterol use two time or less a week on average (not counting use with activity) * Cough interfering with sleep two time or less a month * Oral steroids no more than once a year * No hospitalizations  2. Seasonal and perennial allergic rhinitis -Continue with the use of cetirizine 10mg  daily. - You can use it twice daily on bad days if needed.   3. Gastroesophageal reflux disease - Continue with dietary modifications.  4. Return in about 1 year (around 11/14/2021).    Please inform 11/16/2021 of any Emergency Department visits, hospitalizations, or changes in symptoms. Call us before going to the ED for breathing or allergy symptoms since we might be able to fit you in for a sick visit. Feel free to contact us anytime with any questions, problems, or concerns.  It was a pleasure to meet you today!  Websites that have reliable patient information: 1. American Academy of Asthma, Allergy, and Immunology: www.aaaai.org 2. Food Allergy Research and Education (FARE): foodallergy.org 3. Mothers of Asthmatics: http://www.asthmacommunitynetwork.org 4. American College of Allergy, Asthma, and Immunology: www.acaai.org   COVID-19 Vaccine Information can be found at: [place]  For questions related to vaccine distribution or appointments, please email [email or web address] or call [phone].   We realize that you might be concerned about having an allergic reaction to the COVID19 vaccines. To help with that concern, WE ARE OFFERING THE COVID19 VACCINES IN OUR OFFICE! Ask the front desk for dates!     "Like" [phone] on Facebook and Instagram for our latest updates!      A healthy democracy works best when [place] participate! Make sure you are registered to vote! If you have moved or changed any of your contact information, you will need to get this updated before voting!  In some cases, you MAY be able to register to vote online: [employer]

## 2021-07-21 ENCOUNTER — Other Ambulatory Visit: Payer: Self-pay | Admitting: *Deleted

## 2021-07-21 MED ORDER — ALBUTEROL SULFATE HFA 108 (90 BASE) MCG/ACT IN AERS
2.0000 | INHALATION_SPRAY | RESPIRATORY_TRACT | 0 refills | Status: DC | PRN
Start: 2021-07-21 — End: 2021-09-30

## 2021-09-30 ENCOUNTER — Other Ambulatory Visit: Payer: Self-pay | Admitting: Allergy & Immunology

## 2021-10-10 NOTE — Progress Notes (Signed)
FOLLOW UP Date of Service/Encounter:  10/13/21   Subjective:  Sarah Drake (DOB: 09-13-1985) is a 36 y.o. female who [facility] in re-evaluation of the following: Moderate persistent asthma, seasonal and perennial allergic rhinitis, GERD History obtained from: chart review and patient.  For Review, LV was on 11/14/20  with Dr. Dellis Anes.  She was controlled at that time and no changes were made to her medications.  There was concern that she may need a medication to help with reflux, but this was not started.  Today presents for follow-up. States she has been overall well-controlled.  Takes Flovent 110, 2 puffs daily. Using albuterol 2 to 3 times per week at least due to work conditions. She does work in an atmosphere that is hot and has a lot of dust, causing her to get out of breath and start coughing.  She also notices that doing exercise such as walking causes her to get out of breath and start coughing and wheezing.  She would like to walk because she is trying to lose weight.  She is not using albuterol prior to exercise.ACT 16/25  For allergic rhinitis and conjunctivitis-she is using Benadryl nightly. This makes her drowsy.  She states she has never tried Zyrtec. She has used some nasal sprays in the past, and she uses it as needed.  She is not currently using anything regularly for her nasal symptoms.  Does report constant itching and congestion and rubbing of her nose  She is not having issues with her reflux.    Allergies as of 10/13/2021   No Known Allergies      Medication List        Accurate as of October 13, 2021 11:23 AM. If you have any questions, ask your nurse or doctor.          albuterol (2.5 MG/3ML) 0.083% nebulizer solution Commonly known as: PROVENTIL Take 3 mLs (2.5 mg total) by nebulization every 6 (six) hours as needed for wheezing or shortness of breath. What changed: Another medication with  the same name was removed. Continue taking this medication, and follow the directions you see here. Changed by: Tonny Bollman, MD   albuterol 108 (90 Base) MCG/ACT inhaler Commonly known as: VENTOLIN HFA INHALE 2 PUFFS BY MOUTH EVERY 4 HOURS AS NEEDED FOR WHEEZING FOR SHORTNESS OF BREATH. NEED OFFICE VISIT IN [facility]. What changed: Another medication with the same name was removed. Continue taking this medication, and follow the directions you see here. Changed by: Tonny Bollman, MD   cetirizine 10 MG tablet Commonly known as: ZYRTEC Take 1 tablet (10 mg total) by mouth daily as needed.   diphenhydrAMINE 25 MG tablet Commonly known as: BENADRYL Take 25 mg by mouth every 6 (six) hours as needed.   Flovent HFA 110 MCG/ACT inhaler Generic drug: fluticasone 2 puffs once a day with a spacer to prevent coughing and wheeze rinse and spit after use       Past Medical History:  Diagnosis Date   Anemia    low iron   Asthma    Complication of anesthesia    post op nausea and vomiting   Depression    chronic depression   Past Surgical History:  Procedure Laterality Date   BALLOON DILATION N/A 07/11/2018   Procedure: BALLOON DILATION;  Surgeon: Lemar Lofty., MD;  Location: [facility];  Service: Gastroenterology;  Laterality: N/A;   BILIARY STENT PLACEMENT N/A 07/11/2018  Procedure: BILIARY STENT PLACEMENT;  Surgeon: Meridee Score Netty Starring., MD;  Location: [facility];  Service: Gastroenterology;  Laterality: N/A;  10x 7   BIOPSY  07/11/2018   Procedure: BIOPSY;  Surgeon: Meridee Score Netty Starring., MD;  Location: [facility];  Service: Gastroenterology;;   CESAREAN SECTION     CHOLECYSTECTOMY N/A 07/10/2018   Procedure: LAPAROSCOPIC CHOLECYSTECTOMY WITH INTRAOPERATIVE CHOLANGIOGRAM, 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;  Surgeon: Ovidio Kin, MD;  Location: [facility];  Service: General;  Laterality: N/A;   CHOLECYSTECTOMY     ERCP N/A 07/11/2018   Procedure:  ENDOSCOPIC RETROGRADE CHOLANGIOPANCREATOGRAPHY (ERCP);  Surgeon: Lemar Lofty., MD;  Location: [facility];  Service: Gastroenterology;  Laterality: N/A;   ERCP N/A 10/05/2018   Procedure: ENDOSCOPIC RETROGRADE CHOLANGIOPANCREATOGRAPHY (ERCP);  Surgeon: Lemar Lofty., MD;  Location: [facility];  Service: Gastroenterology;  Laterality: N/A;   PANCREATIC STENT PLACEMENT  07/11/2018   Procedure: PANCREATIC STENT PLACEMENT;  Surgeon: Meridee Score Netty Starring., MD;  Location: [facility];  Service: Gastroenterology;;  4x7   REMOVAL OF STONES  07/11/2018   Procedure: REMOVAL OF STONES;  Surgeon: Lemar Lofty., MD;  Location: [facility];  Service: Gastroenterology;;   REMOVAL OF STONES  10/05/2018   Procedure: REMOVAL OF STONES;  Surgeon: Lemar Lofty., MD;  Location: [facility];  Service: Gastroenterology;;   Dennison Mascot  07/11/2018   Procedure: Dennison Mascot;  Surgeon: Mansouraty, Netty Starring., MD;  Location: [facility];  Service: Gastroenterology;;   Francine Graven REMOVAL  10/05/2018   Procedure: STENT REMOVAL;  Surgeon: Lemar Lofty., MD;  Location: [facility];  Service: Gastroenterology;;   TUBAL LIGATION     Otherwise, there have been no changes to her past medical history, surgical history, family history, or social history.  ROS: All others negative except as noted per HPI.   Objective:  BP 122/72    Pulse 84    Temp 98.7 F (37.1 C) (Temporal)    Ht 5\' 6"  (1.676 m)    Wt 242 lb 11.2 oz (110.1 kg)    SpO2 98%    BMI 39.17 kg/m  Body mass index is 39.17 kg/m. Physical Exam: General Appearance:  Alert, cooperative, no distress, appears stated age  Head:  Normocephalic, without obvious abnormality, atraumatic  Eyes:  Conjunctiva clear, EOM's intact  Nose: Nares normal,  nasal crease line present, hypertrophic turbinates, normal mucosa, and no visible anterior polyps  Throat: Lips, tongue normal; teeth and gums normal, normal posterior  oropharynx  Neck: Supple, symmetrical  Lungs:   clear to auscultation bilaterally, Respirations unlabored, no coughing  Heart:  regular rate and rhythm and no murmur, Appears well perfused  Extremities: No edema  Skin: Skin color, texture, turgor normal, no rashes or lesions on visualized portions of skin  Neurologic: No gross deficits  Spirometry:  Tracings reviewed. Her effort: Variable effort-results affected. FVC: 3.54L FEV1: 2.86L, 100% predicted FEV1/FVC ratio: 96% Interpretation: No overt abnormalities noted given today's efforts.  Please see scanned spirometry results for details.  Assessment/Plan  Asthma uncontrolled; spirometry results do not show obstruction today, but she is reporting several times weekly asthma symptoms and is having a difficult time doing exercise.  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 Moderate persistent asthma, uncomplicated - Lung testing looks stable today. - We are going to continue with the same medications since this seems to be working so well.  - Spacer use reviewed. - Daily controller medication(s): increase Flovent 2 puffs twice daily  with spacer - Prior to physical activity: albuterol 2 puffs 10-15 minutes before physical activity. - Rescue medications: albuterol 4 puffs every 4-6 hours as needed - Changes during respiratory infections or worsening symptoms: Increase Flovent to 4 puffs twice daily for TWO WEEKS. - Asthma control goals:  * Full participation in all desired activities (may need albuterol before activity) * Albuterol use two time or less a week on average (not counting use with activity) * Cough interfering with sleep two time or less a month * Oral steroids no more than once a year * No hospitalizations  2. Seasonal and perennial allergic rhinitis -Continue with the use of cetirizine 10mg  daily. - You can use it twice daily on bad days if needed.  - use in place benadryl  (diphenhydramine)  - start Flonase (fluticasone) 1-2 sprays in each nostril daily to help with itching, congestion, drainage  3. Gastroesophageal reflux disease - Continue with dietary modifications.  4. Return in about 3 months (around 01/10/2022).    Please inform 01/12/2022 of any Emergency Department visits, hospitalizations, or changes in symptoms. Call us before going to the ED for breathing or allergy symptoms since we might be able to fit you in for a sick visit. Feel free to contact us anytime with any questions, problems, or concerns.  It was a pleasure to meet you today!  Websites that have reliable patient information: 1. American Academy of Asthma, Allergy, and Immunology: www.aaaai.org 2. Food Allergy Research and Education (FARE): foodallergy.org 3. Mothers of Asthmatics: http://www.asthmacommunitynetwork.org 4. American College of Allergy, Asthma, and Immunology: www.acaai.[place], MD  [facility]

## 2021-10-13 ENCOUNTER — Ambulatory Visit (INDEPENDENT_AMBULATORY_CARE_PROVIDER_SITE_OTHER): Payer: Commercial Managed Care - PPO | Admitting: Internal Medicine

## 2021-10-13 ENCOUNTER — Other Ambulatory Visit: Payer: Self-pay

## 2021-10-13 ENCOUNTER — Encounter: Payer: Self-pay | Admitting: Internal Medicine

## 2021-10-13 VITALS — BP 122/72 | HR 84 | Temp 98.7°F | Ht 66.0 in | Wt 242.7 lb

## 2021-10-13 DIAGNOSIS — J4540 Moderate persistent asthma, uncomplicated: Secondary | ICD-10-CM

## 2021-10-13 MED ORDER — FLUTICASONE PROPIONATE 50 MCG/ACT NA SUSP: 2.0000 | Freq: Every day | NASAL | 11 refills | Status: DC

## 2021-10-13 MED ORDER — FLUTICASONE PROPIONATE HFA 110 MCG/ACT IN AERO: 2.0000 | INHALATION_SPRAY | Freq: Two times a day (BID) | RESPIRATORY_TRACT | 11 refills | Status: DC

## 2021-10-13 MED ORDER — ALBUTEROL SULFATE HFA 108 (90 BASE) MCG/ACT IN AERS
INHALATION_SPRAY | RESPIRATORY_TRACT | 3 refills | Status: DC
Start: 2021-10-13 — End: 2022-10-23

## 2021-10-13 MED ORDER — CETIRIZINE HCL 10 MG PO TABS: 10.0000 mg | ORAL_TABLET | Freq: Every day | ORAL | 11 refills | Status: DC | PRN

## 2021-10-13 NOTE — Patient Instructions (Addendum)
1. Moderate persistent asthma, uncomplicated - Lung testing looks stable today. - We are going to continue with the same medications since this seems to be working so well.  - Spacer use reviewed. - Daily controller medication(s): increase Flovent 2 puffs twice daily with spacer - Prior to physical activity: albuterol 2 puffs 10-15 minutes before physical activity. - Rescue medications: albuterol 4 puffs every 4-6 hours as needed - Changes during respiratory infections or worsening symptoms: Increase Flovent to 4 puffs twice daily for TWO WEEKS. - Asthma control goals:  * Full participation in all desired activities (may need albuterol before activity) * Albuterol use two time or less a week on average (not counting use with activity) * Cough interfering with sleep two time or less a month * Oral steroids no more than once a year * No hospitalizations  2. Seasonal and perennial allergic rhinitis -Continue with the use of cetirizine 10mg  daily. - You can use it twice daily on bad days if needed.  - use in place benadryl (diphenhydramine)  - start Flonase (fluticasone) 1-2 sprays in each nostril daily to help with itching, congestion, drainage  3. Gastroesophageal reflux disease - Continue with dietary modifications.  4. Return in about 3 months (around 01/10/2022).    Please inform 01/12/2022 of any Emergency Department visits, hospitalizations, or changes in symptoms. Call us before going to the ED for breathing or allergy symptoms since we might be able to fit you in for a sick visit. Feel free to contact us anytime with any questions, problems, or concerns.  It was a pleasure to meet you today!  Websites that have reliable patient information: 1. American Academy of Asthma, Allergy, and Immunology: www.aaaai.org 2. Food Allergy Research and Education (FARE): foodallergy.org 3. Mothers of Asthmatics: http://www.asthmacommunitynetwork.org 4. American College of Allergy,  Asthma, and Immunology: www.acaai.org

## 2021-11-19 IMAGING — CR DG CHEST 2V
2 series · 2 of 2 positions shown · non-contrast
Comparison: 09/20/2016.

CLINICAL DATA: Chest pain.

EXAM:
CHEST - 2 VIEW

[w chest pa]
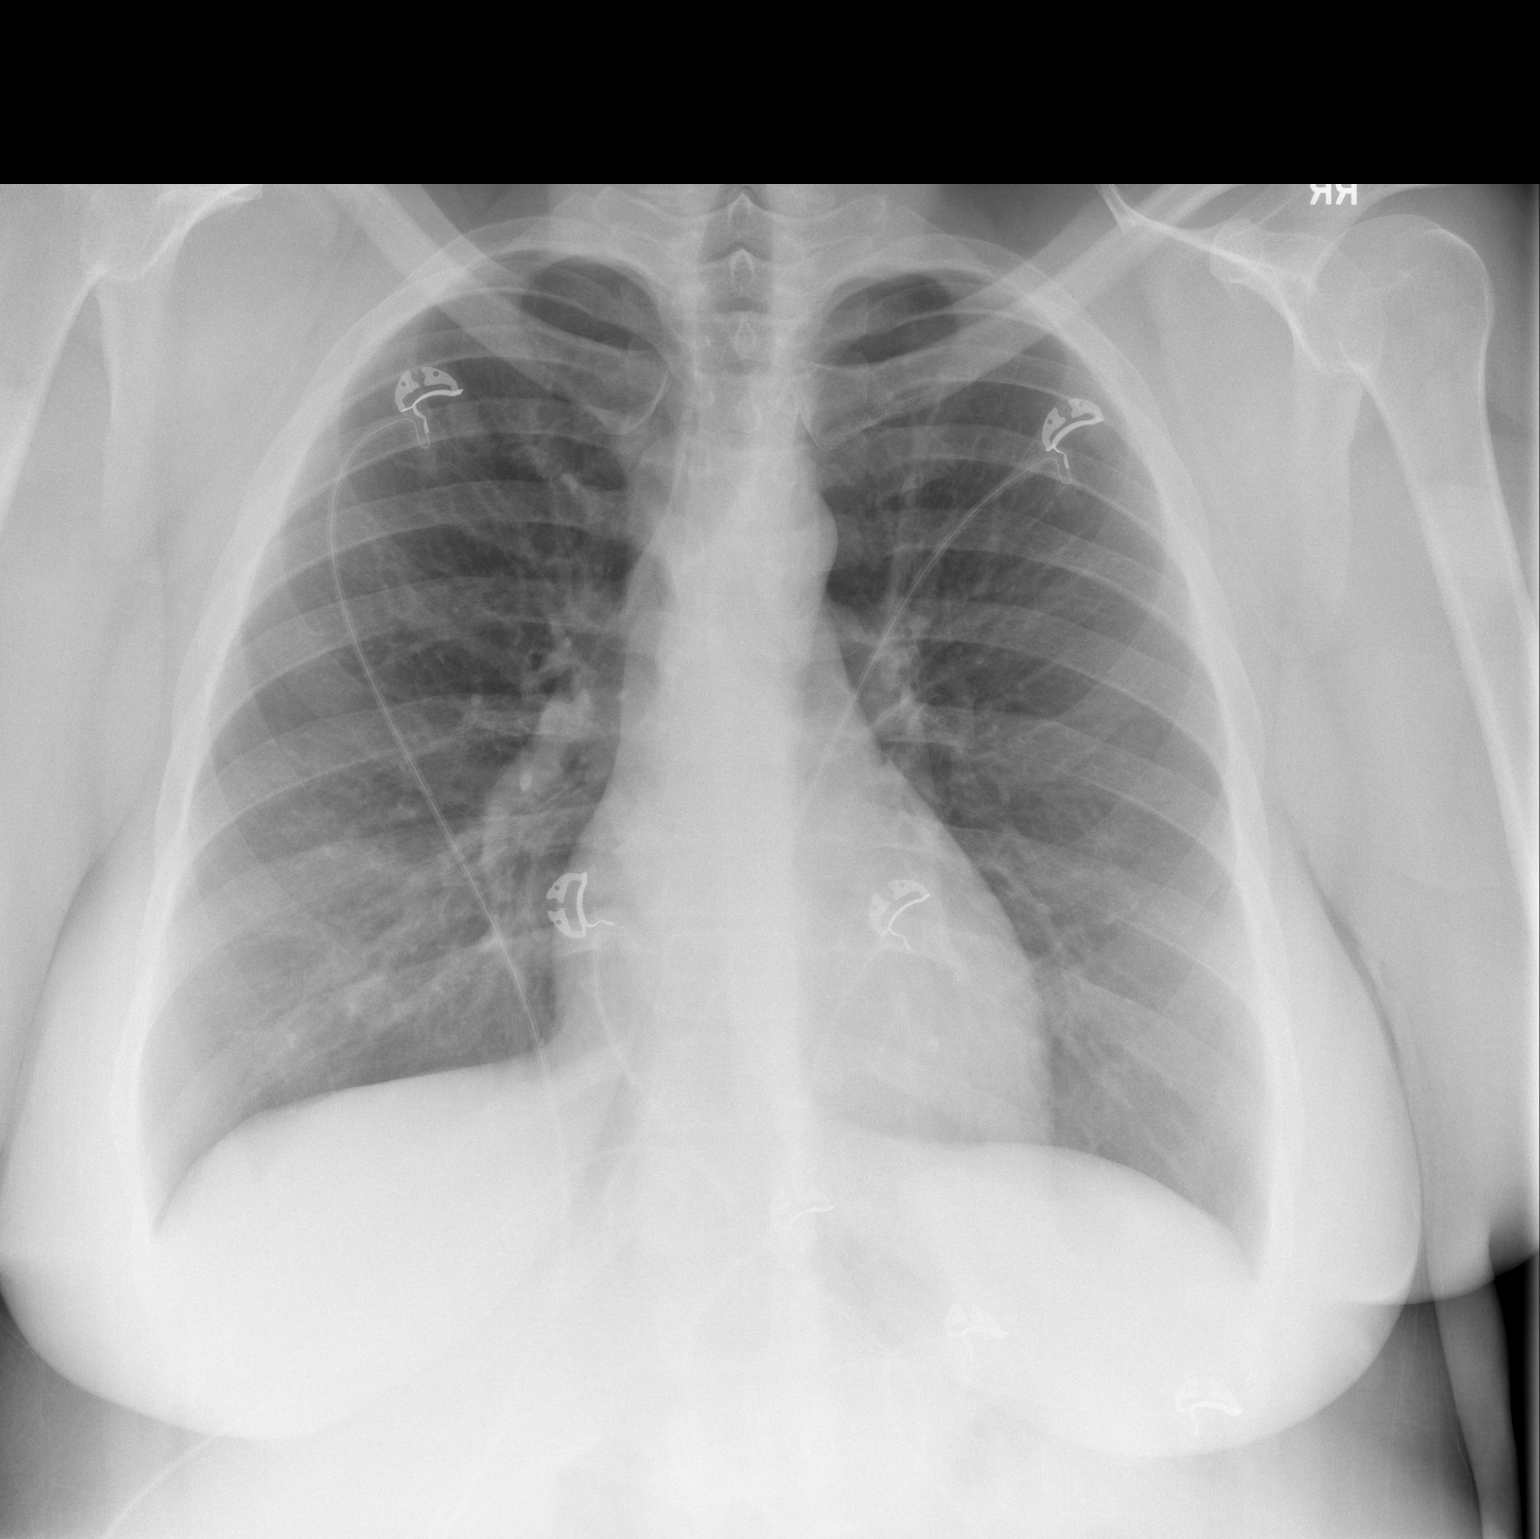

[w chest lat]
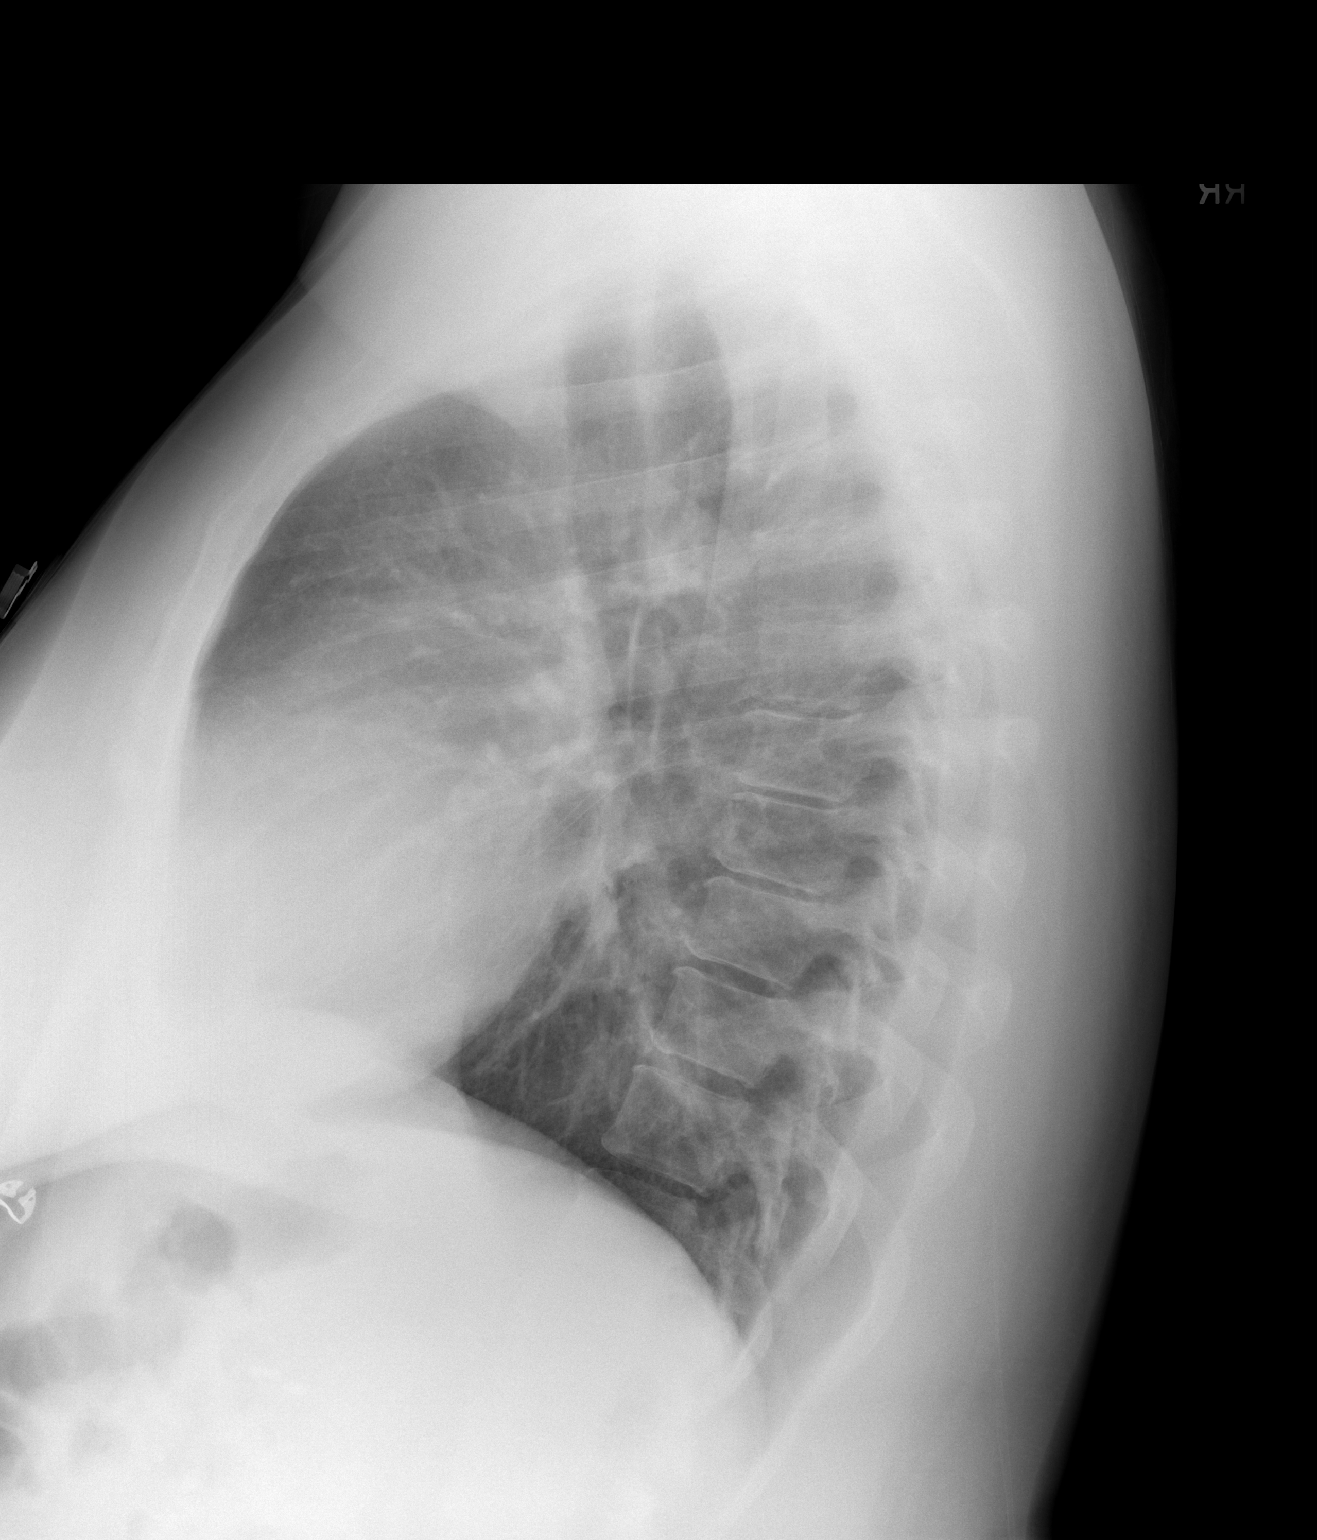

[2 of 2 positions shown; findings below may reference images not displayed]

FINDINGS: Mediastinum and hilar structures normal. Lungs are clear. No focal
infiltrate. No pleural effusion or pneumothorax. Heart size normal.
IMPRESSION: No acute cardiopulmonary disease.

## 2022-10-23 ENCOUNTER — Other Ambulatory Visit: Payer: Self-pay | Admitting: Internal Medicine

## 2022-10-26 ENCOUNTER — Ambulatory Visit: Payer: Commercial Managed Care - PPO | Admitting: Internal Medicine

## 2022-10-26 ENCOUNTER — Encounter: Payer: Self-pay | Admitting: Internal Medicine

## 2022-10-26 VITALS — BP 128/82 | HR 93 | Temp 97.9°F | Resp 18 | Wt 227.9 lb

## 2022-10-26 DIAGNOSIS — K219 Gastro-esophageal reflux disease without esophagitis: Secondary | ICD-10-CM | POA: Diagnosis not present

## 2022-10-26 DIAGNOSIS — J302 Other seasonal allergic rhinitis: Secondary | ICD-10-CM

## 2022-10-26 DIAGNOSIS — J4540 Moderate persistent asthma, uncomplicated: Secondary | ICD-10-CM

## 2022-10-26 DIAGNOSIS — J3089 Other allergic rhinitis: Secondary | ICD-10-CM | POA: Diagnosis not present

## 2022-10-26 MED ORDER — ALBUTEROL SULFATE (2.5 MG/3ML) 0.083% IN NEBU: 2.5000 mg | INHALATION_SOLUTION | Freq: Four times a day (QID) | RESPIRATORY_TRACT | 2 refills | Status: AC | PRN

## 2022-10-26 MED ORDER — CETIRIZINE HCL 10 MG PO TABS: 10.0000 mg | ORAL_TABLET | Freq: Every day | ORAL | 6 refills | Status: DC | PRN

## 2022-10-26 MED ORDER — FLUTICASONE-SALMETEROL 115-21 MCG/ACT IN AERO: 2.0000 | INHALATION_SPRAY | Freq: Two times a day (BID) | RESPIRATORY_TRACT | 12 refills | Status: AC

## 2022-10-26 MED ORDER — ALBUTEROL SULFATE HFA 108 (90 BASE) MCG/ACT IN AERS: INHALATION_SPRAY | RESPIRATORY_TRACT | 1 refills | Status: DC

## 2022-10-26 NOTE — Progress Notes (Signed)
FOLLOW UP Date of Service/Encounter:  10/27/22   Subjective:  Sarah Drake (DOB: 12-28-1985) is a 37 y.o. female who returns to the [facility] [place] on 10/26/2022 in re-evaluation of the following: Moderate persistent asthma, seasonal and perennial allergic rhinitis, GERD History obtained from: chart review and patient.  For Review, LV was on 10/13/21 with Dr.Dennis.  At last visit asthma was well-controlled and she was maintained on Flovent 110 mcg 2 puffs twice daily.  Flonase was added as needed for nasal congestion and drainage.  Today presents for follow-up. Asthma: She often forgets to use her flovent about 50% of the time.  Usually forgets on the weekends. She is using her albuterol more at work due to hot environment at work with high temperatures and soft particle exposure..  She sometimes forgets her albuterol due to switching purses during her job when she goes out on the weekend.  Requiring it 3-4 times a day during the week at her job, not during the weekend.  At last visit was discussed changing her jobs but that is an option for her right now.  Rhinitis is well controlled taking cetirizine 10 mg daily.  Using Flonase as needed for nasal congestion.  Requiring Flonase a few times per week.  Admits she could use it more but often forgets.  She is not having issues with her reflux.    Allergies as of 10/26/2022   No Known Allergies      Medication List        Accurate as of October 26, 2022 11:59 PM. If you have any questions, ask your nurse or doctor.          STOP taking these medications    fluticasone 110 MCG/ACT inhaler Commonly known as: Flovent HFA Stopped by: Roney Marion, MD       TAKE these medications    albuterol 108 (90 Base) MCG/ACT inhaler Commonly known as: VENTOLIN HFA INHALE 2 PUFFS BY MOUTH EVERY 4 HOURS AS NEEDED FOR WHEEZING AND SHORTNESS OF BREATHE. CAN USE 2 PUFFS 10-15 MINUTES PRIOR TO EXERCISING   albuterol  (2.5 MG/3ML) 0.083% nebulizer solution Commonly known as: PROVENTIL Take 3 mLs (2.5 mg total) by nebulization every 6 (six) hours as needed for wheezing or shortness of breath.   cetirizine 10 MG tablet Commonly known as: ZYRTEC Take 1 tablet (10 mg total) by mouth daily as needed.   diphenhydrAMINE 25 MG tablet Commonly known as: BENADRYL Take 25 mg by mouth every 6 (six) hours as needed.   fluticasone 50 MCG/ACT nasal spray Commonly known as: FLONASE Place 2 sprays into both nostrils daily.   fluticasone-salmeterol 115-21 MCG/ACT inhaler Commonly known as: ADVAIR HFA Inhale 2 puffs into the lungs 2 (two) times daily. Started by: Roney Marion, MD       Past Medical History:  Diagnosis Date   Anemia    low iron   Asthma    Complication of anesthesia    post op nausea and vomiting   Depression    chronic depression   Past Surgical History:  Procedure Laterality Date   BALLOON DILATION N/A 07/11/2018   Procedure: BALLOON DILATION;  Surgeon: Rush Landmark Telford Nab., MD;  Location: [facility];  Service: Gastroenterology;  Laterality: N/A;   BILIARY STENT PLACEMENT N/A 07/11/2018   Procedure: BILIARY STENT PLACEMENT;  Surgeon: Rush Landmark Telford Nab., MD;  Location: [facility];  Service: Gastroenterology;  Laterality: N/A;  10x 7   BIOPSY  07/11/2018   Procedure: BIOPSY;  Surgeon: Irving Copas., MD;  Location: [facility];  Service: Gastroenterology;;   CESAREAN SECTION     CHOLECYSTECTOMY N/A 07/10/2018   Procedure: LAPAROSCOPIC CHOLECYSTECTOMY WITH INTRAOPERATIVE CHOLANGIOGRAM, 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;  Surgeon: Alphonsa Overall, MD;  Location: [facility];  Service: General;  Laterality: N/A;   CHOLECYSTECTOMY     ERCP N/A 07/11/2018   Procedure: ENDOSCOPIC RETROGRADE CHOLANGIOPANCREATOGRAPHY (ERCP);  Surgeon: Irving Copas., MD;  Location: [facility];  Service: Gastroenterology;  Laterality: N/A;   ERCP N/A 10/05/2018    Procedure: ENDOSCOPIC RETROGRADE CHOLANGIOPANCREATOGRAPHY (ERCP);  Surgeon: Irving Copas., MD;  Location: [place];  Service: Gastroenterology;  Laterality: N/A;   PANCREATIC STENT PLACEMENT  07/11/2018   Procedure: PANCREATIC STENT PLACEMENT;  Surgeon: Rush Landmark Telford Nab., MD;  Location: [facility];  Service: Gastroenterology;;  4x7   REMOVAL OF STONES  07/11/2018   Procedure: REMOVAL OF STONES;  Surgeon: Irving Copas., MD;  Location: [facility];  Service: Gastroenterology;;   REMOVAL OF STONES  10/05/2018   Procedure: REMOVAL OF STONES;  Surgeon: Irving Copas., MD;  Location: [place];  Service: Gastroenterology;;   Joan Mayans  07/11/2018   Procedure: Joan Mayans;  Surgeon: Mansouraty, Telford Nab., MD;  Location: [facility];  Service: Gastroenterology;;   Lavell Islam REMOVAL  10/05/2018   Procedure: STENT REMOVAL;  Surgeon: Irving Copas., MD;  Location: [place];  Service: Gastroenterology;;   TUBAL LIGATION     Otherwise, there have been no changes to her past medical history, surgical history, family history, or social history.  ROS: All others negative except as noted per HPI.   Objective:  BP 128/82   Pulse 93   Temp 97.9 F (36.6 C) (Temporal)   Resp 18   Wt 227 lb 14.4 oz (103.4 kg)   SpO2 97%   BMI 36.78 kg/m  Body mass index is 36.78 kg/m. Physical Exam: General Appearance:  Alert, cooperative, no distress, appears stated age  Head:  Normocephalic, without obvious abnormality, atraumatic  Eyes:  Conjunctiva clear, EOM's intact  Nose: Nares normal,  nasal crease line present, hypertrophic turbinates, normal mucosa, and no visible anterior polyps  Throat: Lips, tongue normal; teeth and gums normal, normal posterior oropharynx  Neck: Supple, symmetrical  Lungs:   clear to auscultation bilaterally, Respirations unlabored, no coughing  Heart:  regular rate and rhythm and no murmur, Appears well perfused   Extremities: No edema  Skin: Skin color, texture, turgor normal, no rashes or lesions on visualized portions of skin  Neurologic: No gross deficits  Spirometry:  Tracings reviewed. Her effort: Variable effort-results affected. FVC: 3.86L FEV1: 2.85L, 106% predicted FEV1/FVC ratio: 78% Interpretation: Spirometry consistent with normal pattern.  Please see scanned spirometry results for details.  Assessment/Plan   Patient Instructions  1. Moderate persistent asthma, uncomplicated - Lung testing looks stable today. - We are going to change your flovent to advair  - Spacer use reviewed. - Daily controller medication(s): START Advair 115/41mg two puffs twice daily with spacer   -place it next to skin care routine.  - Prior to physical activity: albuterol 2 puffs 10-15 minutes before physical activity. - Rescue medications: albuterol 4 puffs every 4-6 hours as needed - Asthma control goals:  * Full participation in all desired activities (may need albuterol before activity) * Albuterol use two time or less a week on average (not counting use with activity) * Cough interfering with sleep two time or less a month * Oral steroids no more than  once a year * No hospitalizations  2. Seasonal and perennial allergic rhinitis - Continue with the use of cetirizine '10mg'$  daily. - You can use it twice daily on bad days if needed.  - Continue Flonase (fluticasone) 1-2 sprays in each nostril daily to help with itching, congestion, drainage  3. Gastroesophageal reflux disease - Continue with dietary modifications.  Follow up: 3 months   Thank you so much for letting me partake in your care today.  Don't hesitate to reach out if you have any additional concerns!  Roney Marion, MD  [facility] [place]

## 2022-10-26 NOTE — Patient Instructions (Addendum)
1. Moderate persistent asthma, uncomplicated - Lung testing looks stable today. - We are going to change your flovent to advair  - Spacer use reviewed. - Daily controller medication(s): START Advair 115/39mg two puffs twice daily with spacer   -place it next to skin care routine.  - Prior to physical activity: albuterol 2 puffs 10-15 minutes before physical activity. - Rescue medications: albuterol 4 puffs every 4-6 hours as needed - Asthma control goals:  * Full participation in all desired activities (may need albuterol before activity) * Albuterol use two time or less a week on average (not counting use with activity) * Cough interfering with sleep two time or less a month * Oral steroids no more than once a year * No hospitalizations  2. Seasonal and perennial allergic rhinitis - Continue with the use of cetirizine '10mg'$  daily. - You can use it twice daily on bad days if needed.  - Continue Flonase (fluticasone) 1-2 sprays in each nostril daily to help with itching, congestion, drainage  3. Gastroesophageal reflux disease - Continue with dietary modifications.  Follow up: 3 months   Thank you so much for letting me partake in your care today.  Don't hesitate to reach out if you have any additional concerns!  ERoney Marion MD  [facility] [place]

## 2022-11-17 ENCOUNTER — Telehealth: Payer: Self-pay | Admitting: Internal Medicine

## 2022-11-17 NOTE — Telephone Encounter (Signed)
Rx was sent for advair as they are no longer making flovent informed pt of this and she stated understanding

## 2022-11-17 NOTE — Telephone Encounter (Signed)
Patient called and stated her flovent inhaler is not at the [facility] main [place].

## 2022-12-02 ENCOUNTER — Telehealth: Payer: Self-pay

## 2022-12-02 NOTE — Telephone Encounter (Signed)
[place] for flovent hfa 110 mcf aer  I called [facility] [phone] spoke to Lajoyce Corners this was changed to advair on 11/17/2022 and patient was informed. She did find the information and will update this was an automatic refill but she will delete it and refill the proper inhaler it will be ready for patient tomorrow after 3pm.

## 2023-01-26 ENCOUNTER — Ambulatory Visit: Payer: Commercial Managed Care - PPO | Admitting: Internal Medicine

## 2023-02-02 ENCOUNTER — Ambulatory Visit: Payer: Commercial Managed Care - PPO | Admitting: Internal Medicine

## 2023-11-08 ENCOUNTER — Other Ambulatory Visit: Payer: Self-pay

## 2023-11-08 MED ORDER — ALBUTEROL SULFATE HFA 108 (90 BASE) MCG/ACT IN AERS: INHALATION_SPRAY | RESPIRATORY_TRACT | 1 refills | Status: AC

## 2024-03-01 ENCOUNTER — Ambulatory Visit (INDEPENDENT_AMBULATORY_CARE_PROVIDER_SITE_OTHER): Admitting: Internal Medicine

## 2024-03-01 ENCOUNTER — Encounter: Payer: Self-pay | Admitting: Internal Medicine

## 2024-03-01 VITALS — BP 118/74 | HR 93 | Temp 97.3°F | Resp 18 | Wt 234.6 lb

## 2024-03-01 DIAGNOSIS — J302 Other seasonal allergic rhinitis: Secondary | ICD-10-CM

## 2024-03-01 DIAGNOSIS — J3089 Other allergic rhinitis: Secondary | ICD-10-CM

## 2024-03-01 DIAGNOSIS — J4540 Moderate persistent asthma, uncomplicated: Secondary | ICD-10-CM | POA: Diagnosis not present

## 2024-03-01 DIAGNOSIS — K219 Gastro-esophageal reflux disease without esophagitis: Secondary | ICD-10-CM | POA: Diagnosis not present

## 2024-03-01 MED ORDER — CETIRIZINE HCL 10 MG PO TABS: 10.0000 mg | ORAL_TABLET | Freq: Every day | ORAL | 6 refills | Status: AC | PRN

## 2024-03-01 MED ORDER — BREZTRI AEROSPHERE 160-9-4.8 MCG/ACT IN AERO: 2.0000 | INHALATION_SPRAY | Freq: Two times a day (BID) | RESPIRATORY_TRACT | 5 refills | Status: AC

## 2024-03-01 MED ORDER — PREDNISONE 10 MG PO TABS: ORAL_TABLET | ORAL | 0 refills | Status: AC

## 2024-03-01 MED ORDER — FLUTICASONE PROPIONATE 50 MCG/ACT NA SUSP: 2.0000 | Freq: Every day | NASAL | 11 refills | Status: AC

## 2024-03-01 MED ORDER — AIRSUPRA 90-80 MCG/ACT IN AERO: 2.0000 | INHALATION_SPRAY | Freq: Two times a day (BID) | RESPIRATORY_TRACT | 5 refills | Status: DC | PRN

## 2024-03-01 NOTE — Progress Notes (Unsigned)
 FOLLOW UP Date of Service/Encounter:  03/03/24   Subjective:  Sarah Drake (DOB: [birth year]) is a 38 y.o. female [facility] in re-evaluation of the following: Moderate persistent asthma, seasonal and perennial allergic rhinitis, GERD History obtained from: chart review and patient.  For Review, LV was on 10/13/21 with Dr.Dennis.  At last visit asthma was well-controlled and she was maintained on Flovent  110 mcg 2 puffs twice daily.  Flonase  was added as needed for nasal congestion and drainage.  Today presents for follow-up. Discussed the use of AI scribe software for clinical note transcription with the patient, who gave verbal consent to proceed.  History of Present Illness Sarah Drake is a 38 year old female with asthma who presents with worsening asthma symptoms.  Asthma symptoms and exacerbating factors - Worsening asthma symptoms, particularly during summer months when hot and dusty - Work environment with limited ventilation exacerbates symptoms - Shortness of breath, wheezing occur at work  - Requires frequent use of albuterol  rescue inhaler, approximately five to six times daily - Continues to use daily inhaler, Advair, as prescribed (two puffs in the morning and two at night) with persistent poor asthma control - Asthma onset in adulthood, no childhood symptoms - Work demands prevent ability to rest during shifts, contributing to respiratory difficulties  Upper airway symptoms - Runny nose at work, attributed to cold air from the vent - Currently taking Zyrtec  for nasal symptoms - Not using Flonase  recently    Allergies as of 03/01/2024   No Known Allergies      Medication List        Accurate as of March 01, 2024 11:59 PM. If you have any questions, ask your nurse or doctor.          Airsupra  90-80 MCG/ACT Aero Generic drug: Albuterol -Budesonide  Inhale 2 puffs into the lungs 2 (two) times  daily as needed. Started by: Anzal Bartnick   albuterol  (2.5 MG/3ML) 0.083% nebulizer solution Commonly known as: PROVENTIL  Take 3 mLs (2.5 mg total) by nebulization every 6 (six) hours as needed for wheezing or shortness of breath.   albuterol  108 (90 Base) MCG/ACT inhaler Commonly known as: VENTOLIN  HFA INHALE 2 PUFFS BY MOUTH EVERY 4 HOURS AS NEEDED FOR WHEEZING AND SHORTNESS OF BREATHE. CAN USE 2 PUFFS 10-15 MINUTES PRIOR TO EXERCISING   Breztri  Aerosphere 160-9-4.8 MCG/ACT Aero inhaler Generic drug: budesonide -glycopyrrolate-formoterol Inhale 2 puffs into the lungs 2 (two) times daily. Started by: Hargis Springer   cetirizine  10 MG tablet Commonly known as: ZYRTEC  Take 1 tablet (10 mg total) by mouth daily as needed.   diphenhydrAMINE  25 MG tablet Commonly known as: BENADRYL  Take 25 mg by mouth every 6 (six) hours as needed.   fluticasone  50 MCG/ACT nasal spray Commonly known as: FLONASE  Place 2 sprays into both nostrils daily.   fluticasone -salmeterol 115-21 MCG/ACT inhaler Commonly known as: ADVAIR HFA Inhale 2 puffs into the lungs 2 (two) times daily.   predniSONE  10 MG tablet Commonly known as: DELTASONE  Prednisone  10mg  : Take 2 tablets twice a day for 3 more days, Then take 2 tablets once a day for 1 day., then take 1 tablet once a day for 1 day. Started by: Hargis Springer       Past Medical History:  Diagnosis Date   Anemia    low iron   Asthma    Complication of anesthesia    post op nausea and vomiting   Depression  chronic depression   Past Surgical History:  Procedure Laterality Date   BALLOON DILATION N/A 07/11/2018   Procedure: BALLOON DILATION;  Surgeon: Mansouraty, Aloha Raddle., MD;  Location: [facility];  Service: Gastroenterology;  Laterality: N/A;   BILIARY STENT PLACEMENT N/A 07/11/2018   Procedure: BILIARY STENT PLACEMENT;  Surgeon: Wilhelmenia Aloha Raddle., MD;  Location: [facility];  Service: Gastroenterology;  Laterality: N/A;   10x 7   BIOPSY  07/11/2018   Procedure: BIOPSY;  Surgeon: Wilhelmenia Aloha Raddle., MD;  Location: [facility];  Service: Gastroenterology;;   CESAREAN SECTION     CHOLECYSTECTOMY N/A 07/10/2018   Procedure: LAPAROSCOPIC CHOLECYSTECTOMY WITH INTRAOPERATIVE CHOLANGIOGRAM, 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;  Surgeon: Ethyl Lenis, MD;  Location: [facility];  Service: General;  Laterality: N/A;   CHOLECYSTECTOMY     ERCP N/A 07/11/2018   Procedure: ENDOSCOPIC RETROGRADE CHOLANGIOPANCREATOGRAPHY (ERCP);  Surgeon: Wilhelmenia Aloha Raddle., MD;  Location: [facility];  Service: Gastroenterology;  Laterality: N/A;   ERCP N/A 10/05/2018   Procedure: ENDOSCOPIC RETROGRADE CHOLANGIOPANCREATOGRAPHY (ERCP);  Surgeon: Wilhelmenia Aloha Raddle., MD;  Location: [facility];  Service: Gastroenterology;  Laterality: N/A;   PANCREATIC STENT PLACEMENT  07/11/2018   Procedure: PANCREATIC STENT PLACEMENT;  Surgeon: Wilhelmenia Aloha Raddle., MD;  Location: [facility];  Service: Gastroenterology;;  4x7   REMOVAL OF STONES  07/11/2018   Procedure: REMOVAL OF STONES;  Surgeon: Wilhelmenia Aloha Raddle., MD;  Location: [facility];  Service: Gastroenterology;;   REMOVAL OF STONES  10/05/2018   Procedure: REMOVAL OF STONES;  Surgeon: Wilhelmenia Aloha Raddle., MD;  Location: [facility];  Service: Gastroenterology;;   ANNETT  07/11/2018   Procedure: ANNETT;  Surgeon: Mansouraty, Aloha Raddle., MD;  Location: [facility];  Service: Gastroenterology;;   CLEDA REMOVAL  10/05/2018   Procedure: STENT REMOVAL;  Surgeon: Wilhelmenia Aloha Raddle., MD;  Location: [facility];  Service: Gastroenterology;;   TUBAL LIGATION     Otherwise, there have been no changes to her past medical history, surgical history, family history, or social history.  ROS: All others negative except as noted per HPI.   Objective:  BP 118/74   Pulse 93   Temp (!) 97.3 F (36.3 C) (Temporal)   Resp 18   Wt 234 lb 9.6 oz  (106.4 kg)   SpO2 98%   BMI 37.87 kg/m  Body mass index is 37.87 kg/m. Physical Exam: General Appearance:  Alert, cooperative, no distress, appears stated age  Head:  Normocephalic, without obvious abnormality, atraumatic  Eyes:  Conjunctiva clear, EOM's intact  Nose: Nares normal, nasal crease line present, hypertrophic turbinates, normal mucosa, and no visible anterior polyps  Throat: Lips, tongue normal; teeth and gums normal, normal posterior oropharynx  Neck: Supple, symmetrical  Lungs:   clear to auscultation bilaterally, Respirations unlabored, no coughing  Heart:  regular rate and rhythm and no murmur, Appears well perfused  Extremities: No edema  Skin: Skin color, texture, turgor normal, no rashes or lesions on visualized portions of skin  Neurologic: No gross deficits  Spirometry:  Tracings reviewed. Her effort: Good reproducible efforts. FVC: 3.77L FEV1: 2.41L, 85% predicted FEV1/FVC ratio: 64% Interpretation: Spirometry consistent with mild obstructive disease.  Please see scanned spirometry results for details.  Assessment/Plan   Patient Instructions  Moderate persistent asthma, not well controlled  - Lung testing looks down from prior, we need to step up care   Prednisone  10mg  : Take 2 tablets twice a day for 3 more days, Then take 2 tablets once a day  for 1 day., then take 1 tablet once a day for 1 day.   Will get biologic labs at follow up   - Spacer use reviewed. - Daily controller medication(s): START Breztri  2 puff twice daily    -place it next to skin care routine.  - Prior to physical activity: Airsupra  2 puff  10-15 minutes before physical activity. - Rescue medications: Airsupra  2 puff  - Asthma control goals:  * Full participation in all desired activities (may need albuterol  before activity) * Albuterol  use two time or less a week on average (not counting use with activity) * Cough interfering with sleep two time or less a month * Oral steroids no  more than once a year * No hospitalizations  2. Seasonal and perennial allergic rhinitis - Continue with the use of cetirizine  10mg  daily. - You can use it twice daily on bad days if needed.  -  start  Flonase  (fluticasone ) 1-2 sprays in each nostril daily to help with itching, congestion, drainage  3. Gastroesophageal reflux disease - Continue with dietary modifications.  Follow up: 4 weeks   Thank you so much for letting me partake in your care today.  Don't hesitate to reach out if you have any additional concerns!  Hargis Springer, MD  [facility]- [place], [place]

## 2024-03-01 NOTE — Patient Instructions (Addendum)
 Moderate persistent asthma, not well controlled  - Lung testing looks down from prior, we need to step up care   Prednisone  10mg  : Take 2 tablets twice a day for 3 more days, Then take 2 tablets once a day for 1 day., then take 1 tablet once a day for 1 day.   Will get biologic labs at follow up   - Spacer use reviewed. - Daily controller medication(s): START Breztri  2 puff twice daily    -place it next to skin care routine.  - Prior to physical activity: Airsupra  2 puff  10-15 minutes before physical activity. - Rescue medications: Airsupra  2 puff  - Asthma control goals:  * Full participation in all desired activities (may need albuterol  before activity) * Albuterol  use two time or less a week on average (not counting use with activity) * Cough interfering with sleep two time or less a month * Oral steroids no more than once a year * No hospitalizations  2. Seasonal and perennial allergic rhinitis - Continue with the use of cetirizine  10mg  daily. - You can use it twice daily on bad days if needed.  -  start  Flonase  (fluticasone ) 1-2 sprays in each nostril daily to help with itching, congestion, drainage  3. Gastroesophageal reflux disease - Continue with dietary modifications.  Follow up: 4 weeks   Thank you so much for letting me partake in your care today.  Don't hesitate to reach out if you have any additional concerns!  Hargis Springer, MD  [facility]- [place], [place]

## 2024-03-13 ENCOUNTER — Other Ambulatory Visit (HOSPITAL_COMMUNITY): Payer: Self-pay

## 2024-03-13 ENCOUNTER — Telehealth: Payer: Self-pay

## 2024-03-13 MED ORDER — AIRSUPRA 90-80 MCG/ACT IN AERO: 2.0000 | INHALATION_SPRAY | Freq: Two times a day (BID) | RESPIRATORY_TRACT | 5 refills | Status: AC | PRN

## 2024-03-13 NOTE — Telephone Encounter (Signed)
 Pharmacy Patient Advocate Encounter  Received notification from [facility] that Prior Authorization for Airsupra  90-80MCG/ACT aerosol  has been CANCELLED due to: Medication excluded from coverage.    PA #/Case ID/Reference #: [id number]

## 2024-03-13 NOTE — Telephone Encounter (Signed)
 Resent RX with co pay card saving info

## 2024-03-13 NOTE — Addendum Note (Signed)
 Addended by: ONEITA CHRISTIANS D on: 03/13/2024 04:32 PM   Modules accepted: Orders

## 2024-03-13 NOTE — Telephone Encounter (Signed)
 Pharmacy says Airsupra  is not on the formulary, cost is $600.

## 2024-03-15 MED ORDER — BUDESONIDE-FORMOTEROL FUMARATE 80-4.5 MCG/ACT IN AERO: 2.0000 | INHALATION_SPRAY | Freq: Two times a day (BID) | RESPIRATORY_TRACT | 5 refills | Status: AC | PRN

## 2024-03-15 NOTE — Addendum Note (Signed)
 Addended by: ONEITA CHRISTIANS D on: 03/15/2024 01:39 PM   Modules accepted: Orders

## 2024-03-15 NOTE — Telephone Encounter (Addendum)
 Tried calling no answer. Left a voicemail for a return call.   Symibort has been sent to [facility] on [address]

## 2024-03-24 NOTE — Telephone Encounter (Signed)
Left detailed message per dpr  

## 2024-03-29 ENCOUNTER — Ambulatory Visit: Admitting: Internal Medicine

## 2024-04-11 ENCOUNTER — Ambulatory Visit: Admitting: Internal Medicine

## 2024-04-25 ENCOUNTER — Ambulatory Visit (INDEPENDENT_AMBULATORY_CARE_PROVIDER_SITE_OTHER): Admitting: Internal Medicine

## 2024-04-25 ENCOUNTER — Encounter: Payer: Self-pay | Admitting: Internal Medicine

## 2024-04-25 ENCOUNTER — Other Ambulatory Visit: Payer: Self-pay

## 2024-04-25 VITALS — BP 116/78 | HR 78 | Temp 98.1°F | Resp 18 | Ht 65.0 in | Wt 219.6 lb

## 2024-04-25 DIAGNOSIS — J4540 Moderate persistent asthma, uncomplicated: Secondary | ICD-10-CM | POA: Diagnosis not present

## 2024-04-25 DIAGNOSIS — K219 Gastro-esophageal reflux disease without esophagitis: Secondary | ICD-10-CM

## 2024-04-25 DIAGNOSIS — J302 Other seasonal allergic rhinitis: Secondary | ICD-10-CM

## 2024-04-25 DIAGNOSIS — J3089 Other allergic rhinitis: Secondary | ICD-10-CM

## 2024-04-25 NOTE — Progress Notes (Signed)
 FOLLOW UP Date of Service/Encounter:  04/25/24   Subjective:  Sarah Drake (DOB: May 20, 1986) is a 38 y.o. female who [facility]24 in re-evaluation of the following: Moderate persistent asthma, seasonal and perennial allergic rhinitis, GERD History obtained from: chart review and patient.  For Review, LV was on 03/01/24 with Dr. Lorin .   Last visit asthma is not well-controlled likely due to noncompliance with Breztri .  She was treated with prednisone  and Flonase  added for increased nasal congestion  Today presents for follow-up. Discussed the use of AI scribe software for clinical note transcription with the patient, who gave verbal consent to proceed.  History of Present Illness  Sarah Drake is a 38 year old female with asthma who presents for follow-up regarding her asthma management.  Asthma symptoms and management - Asthma with increased symptoms at work due to exposure to dust and other irritants - Currently prescribed Breztri  inhaler, two puffs twice daily, but does not consistently adhere to dosing, especially when asymptomatic at home - She acknowledges she is post to take Breztri  2 puffs twice daily.  Does not take when she is at home.  She feels like she should not have to she is not symptomatic. - Will need Airsupra  at work, but not when at home  - She is unsure but estimates receiving 50-60% of breztri  doses  - No OCS/ABX/ED/UC visits since last visit   Allergic rhinitis symptoms and management - Started Flonase  nasal spray at last visit - Flonase  provides symptomatic relief for nasal congestion  - still taking cetirizine  daily   Gastroesophageal reflux symptoms - Reflux symptoms controlled by dietary modification, specifically avoiding hot sauce and other spicy foods - Symptoms remain manageable with adherence to dietary restrictions     Allergies as of 04/25/2024   No Known Allergies       Medication List        Accurate as of April 25, 2024  5:35 PM. If you have any questions, ask your nurse or doctor.          Airsupra  90-80 MCG/ACT Aero Generic drug: Albuterol -Budesonide  Inhale 2 puffs into the lungs 2 (two) times daily as needed.   albuterol  (2.5 MG/3ML) 0.083% nebulizer solution Commonly known as: PROVENTIL  Take 3 mLs (2.5 mg total) by nebulization every 6 (six) hours as needed for wheezing or shortness of breath.   albuterol  108 (90 Base) MCG/ACT inhaler Commonly known as: VENTOLIN  HFA INHALE 2 PUFFS BY MOUTH EVERY 4 HOURS AS NEEDED FOR WHEEZING AND SHORTNESS OF BREATHE. CAN USE 2 PUFFS 10-15 MINUTES PRIOR TO EXERCISING   Breztri  Aerosphere 160-9-4.8 MCG/ACT Aero inhaler Generic drug: budesonide -glycopyrrolate-formoterol  Inhale 2 puffs into the lungs 2 (two) times daily.   budesonide -formoterol  80-4.5 MCG/ACT inhaler Commonly known as: Symbicort  Inhale 2 puffs into the lungs 2 (two) times daily as needed.   cetirizine  10 MG tablet Commonly known as: ZYRTEC  Take 1 tablet (10 mg total) by mouth daily as needed.   diphenhydrAMINE  25 MG tablet Commonly known as: BENADRYL  Take 25 mg by mouth every 6 (six) hours as needed.   fluticasone  50 MCG/ACT nasal spray Commonly known as: FLONASE  Place 2 sprays into both nostrils daily.   fluticasone -salmeterol 115-21 MCG/ACT inhaler Commonly known as: ADVAIR HFA Inhale 2 puffs into the lungs 2 (two) times daily.   predniSONE  10 MG tablet Commonly known as: DELTASONE  Prednisone  10mg  : Take 2 tablets twice a day for 3 more days, Then take  2 tablets once a day for 1 day., then take 1 tablet once a day for 1 day.       Past Medical History:  Diagnosis Date   Anemia    low iron   Asthma    Complication of anesthesia    post op nausea and vomiting   Depression    chronic depression   Past Surgical History:  Procedure Laterality Date   BALLOON DILATION N/A 07/11/2018   Procedure: BALLOON DILATION;   Surgeon: Mansouraty, Aloha Raddle., MD;  Location: [facility];  Service: Gastroenterology;  Laterality: N/A;   BILIARY STENT PLACEMENT N/A 07/11/2018   Procedure: BILIARY STENT PLACEMENT;  Surgeon: Wilhelmenia Aloha Raddle., MD;  Location: [facility];  Service: Gastroenterology;  Laterality: N/A;  10x 7   BIOPSY  07/11/2018   Procedure: BIOPSY;  Surgeon: Wilhelmenia Aloha Raddle., MD;  Location: [facility];  Service: Gastroenterology;;   CESAREAN SECTION     CHOLECYSTECTOMY N/A 07/10/2018   Procedure: LAPAROSCOPIC CHOLECYSTECTOMY WITH INTRAOPERATIVE CHOLANGIOGRAM, 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;  Surgeon: Ethyl Lenis, MD;  Location: [facility];  Service: General;  Laterality: N/A;   CHOLECYSTECTOMY     ERCP N/A 07/11/2018   Procedure: ENDOSCOPIC RETROGRADE CHOLANGIOPANCREATOGRAPHY (ERCP);  Surgeon: Wilhelmenia Aloha Raddle., MD;  Location: [facility];  Service: Gastroenterology;  Laterality: N/A;   ERCP N/A 10/05/2018   Procedure: ENDOSCOPIC RETROGRADE CHOLANGIOPANCREATOGRAPHY (ERCP);  Surgeon: Wilhelmenia Aloha Raddle., MD;  Location: [facility];  Service: Gastroenterology;  Laterality: N/A;   PANCREATIC STENT PLACEMENT  07/11/2018   Procedure: PANCREATIC STENT PLACEMENT;  Surgeon: Wilhelmenia Aloha Raddle., MD;  Location: [facility];  Service: Gastroenterology;;  4x7   REMOVAL OF STONES  07/11/2018   Procedure: REMOVAL OF STONES;  Surgeon: Wilhelmenia Aloha Raddle., MD;  Location: [facility];  Service: Gastroenterology;;   REMOVAL OF STONES  10/05/2018   Procedure: REMOVAL OF STONES;  Surgeon: Wilhelmenia Aloha Raddle., MD;  Location: [facility];  Service: Gastroenterology;;   ANNETT  07/11/2018   Procedure: ANNETT;  Surgeon: Mansouraty, Aloha Raddle., MD;  Location: [facility];  Service: Gastroenterology;;   CLEDA REMOVAL  10/05/2018   Procedure: STENT REMOVAL;  Surgeon: Wilhelmenia Aloha Raddle., MD;  Location: [facility] END[facility]COPY;  Service: Gastroenterology;;    TUBAL LIGATION     Otherwise, there have been no changes to her past medical history, surgical history, family history, or social history.  ROS: All others negative except as noted per HPI.   Objective:  BP 116/78 (BP Location: Right Arm, Patient Position: Sitting, Cuff Size: Normal)   Pulse 78   Temp 98.1 F (36.7 C) (Temporal)   Resp 18   Ht 5' 5 (1.651 m)   Wt 219 lb 9.6 oz (99.6 kg)   SpO2 98%   BMI 36.54 kg/m  Body mass index is 36.54 kg/m. Physical Exam: General Appearance:  Alert, cooperative, no distress, appears stated age  Head:  Normocephalic, without obvious abnormality, atraumatic  Eyes:  Conjunctiva clear, EOM's intact  Nose: Nares normal, hypertrophic turbinates, normal mucosa, and no visible anterior polyps  Throat: Lips, tongue normal; teeth and gums normal, normal posterior oropharynx  Neck: Supple, symmetrical  Lungs:   clear to auscultation bilaterally, Respirations unlabored, no coughing  Heart:  regular rate and rhythm and no murmur, Appears well perfused  Extremities: No edema  Skin: Skin color, texture, turgor normal, no rashes or lesions on visualized portions of skin  Neurologic: No gross deficits  Spirometry:  Tracings reviewed. Her effort: Good reproducible efforts.  FVC: 3.78L FEV1: 2.96L, 108% predicted FEV1/FVC ratio: 78% Interpretation: Spirometry consistent with normal pattern.  Please see scanned spirometry results for details.  Assessment/Plan   Patient Instructions  Moderate persistent asthma, improved  - Lung testing looks good - Spacer use reviewed. - Daily controller medication(s): Continue Breztri  2 puff twice daily    Make sure to get every dose every day - Prior to physical activity: Airsupra  2 puff  10-15 minutes before physical activity. - Rescue medications: Airsupra  2 puff  - Asthma control goals:  * Full participation in all desired activities (may need albuterol  before activity) * Albuterol  use two time or less a week  on average (not counting use with activity) * Cough interfering with sleep two time or less a month * Oral steroids no more than once a year * No hospitalizations  2. Seasonal and perennial allergic rhinitis - Continue with the use of cetirizine  10mg  daily. - You can use it twice daily on bad days if needed.  - Continue Flonase  (fluticasone ) 1-2 sprays in each nostril daily to help with itching, congestion, drainage  3. Gastroesophageal reflux disease - Continue with dietary modifications.  Follow up: 4 weeks   Thank you so much for letting me partake in your care today.  Don't hesitate to reach out if you have any additional concerns!  Hargis Springer, MD  [facility]- [place], [place]

## 2024-04-25 NOTE — Patient Instructions (Addendum)
 Moderate persistent asthma, improved  - Lung testing looks good - Spacer use reviewed. - Daily controller medication(s): Continue Breztri  2 puff twice daily    Make sure to get every dose every day - Prior to physical activity: Airsupra  2 puff  10-15 minutes before physical activity. - Rescue medications: Airsupra  2 puff  - Asthma control goals:  * Full participation in all desired activities (may need albuterol  before activity) * Albuterol  use two time or less a week on average (not counting use with activity) * Cough interfering with sleep two time or less a month * Oral steroids no more than once a year * No hospitalizations  2. Seasonal and perennial allergic rhinitis - Continue with the use of cetirizine  10mg  daily. - You can use it twice daily on bad days if needed.  - Continue Flonase  (fluticasone ) 1-2 sprays in each nostril daily to help with itching, congestion, drainage  3. Gastroesophageal reflux disease - Continue with dietary modifications.  Follow up: 4 weeks   Thank you so much for letting me partake in your care today.  Don't hesitate to reach out if you have any additional concerns!  Hargis Springer, MD  [facility]- [place], [place]

## 2024-07-22 ENCOUNTER — Emergency Department (HOSPITAL_BASED_OUTPATIENT_CLINIC_OR_DEPARTMENT_OTHER)
Admission: EM | Admit: 2024-07-22 | Discharge: 2024-07-22 | Disposition: A | Discharge status: Home / Self Care | Attending: Emergency Medicine | Admitting: Emergency Medicine

## 2024-07-22 ENCOUNTER — Emergency Department (HOSPITAL_BASED_OUTPATIENT_CLINIC_OR_DEPARTMENT_OTHER)

## 2024-07-22 ENCOUNTER — Encounter (HOSPITAL_BASED_OUTPATIENT_CLINIC_OR_DEPARTMENT_OTHER): Payer: Self-pay | Admitting: Urology

## 2024-07-22 ENCOUNTER — Other Ambulatory Visit: Payer: Self-pay

## 2024-07-22 DIAGNOSIS — J45909 Unspecified asthma, uncomplicated: Secondary | ICD-10-CM | POA: Diagnosis not present

## 2024-07-22 DIAGNOSIS — R112 Nausea with vomiting, unspecified: Secondary | ICD-10-CM | POA: Diagnosis present

## 2024-07-22 DIAGNOSIS — Z7951 Long term (current) use of inhaled steroids: Secondary | ICD-10-CM | POA: Diagnosis not present

## 2024-07-22 DIAGNOSIS — R1031 Right lower quadrant pain: Secondary | ICD-10-CM | POA: Diagnosis not present

## 2024-07-22 DIAGNOSIS — K2980 Duodenitis without bleeding: Secondary | ICD-10-CM

## 2024-07-22 DIAGNOSIS — R1013 Epigastric pain: Secondary | ICD-10-CM | POA: Diagnosis not present

## 2024-07-22 DIAGNOSIS — R7401 Elevation of levels of liver transaminase levels: Secondary | ICD-10-CM | POA: Diagnosis not present

## 2024-07-22 DIAGNOSIS — R1032 Left lower quadrant pain: Secondary | ICD-10-CM | POA: Diagnosis not present

## 2024-07-22 LAB — URINE DRUG SCREEN
Amphetamines: NEGATIVE
Barbiturates: NEGATIVE
Benzodiazepines: NEGATIVE
Cocaine: NEGATIVE
Fentanyl: NEGATIVE
Methadone Scn, Ur: NEGATIVE
Opiates: NEGATIVE
Tetrahydrocannabinol: POSITIVE — AB

## 2024-07-22 LAB — COMPREHENSIVE METABOLIC PANEL WITH GFR
ALT: 89 U/L — ABNORMAL HIGH (ref 0–44)
AST: 74 U/L — ABNORMAL HIGH (ref 15–41)
Albumin: 4.5 g/dL (ref 3.5–5.0)
Alkaline Phosphatase: 51 U/L (ref 38–126)
Anion gap: 14 (ref 5–15)
BUN: 11 mg/dL (ref 6–20)
CO2: 23 mmol/L (ref 22–32)
Calcium: 9.4 mg/dL (ref 8.9–10.3)
Chloride: 102 mmol/L (ref 98–111)
Creatinine, Ser: 0.69 mg/dL (ref 0.44–1.00)
GFR, Estimated: >60 mL/min (ref >60)
Glucose, Bld: 93 mg/dL (ref 70–99)
Potassium: 3.2 mmol/L — ABNORMAL LOW (ref 3.5–5.1)
Sodium: 138 mmol/L (ref 135–145)
Total Bilirubin: 1 mg/dL (ref 0.0–1.2)
Total Protein: 7.8 g/dL (ref 6.5–8.1)

## 2024-07-22 LAB — CBC
HCT: 33.9 % — ABNORMAL LOW (ref 36.0–46.0)
Hemoglobin: 11.3 g/dL — ABNORMAL LOW (ref 12.0–15.0)
MCH: 29.9 pg (ref 26.0–34.0)
MCHC: 33.3 g/dL (ref 30.0–36.0)
MCV: 89.7 fL (ref 80.0–100.0)
Platelets: 233 K/uL (ref 150–400)
RBC: 3.78 MIL/uL — ABNORMAL LOW (ref 3.87–5.11)
RDW: 14.6 % (ref 11.5–15.5)
WBC: 4.1 K/uL (ref 4.0–10.5)
nRBC: 0 % (ref 0.0–0.2)

## 2024-07-22 LAB — URINALYSIS, ROUTINE W REFLEX MICROSCOPIC
Glucose, UA: 100 mg/dL — AB
Hgb urine dipstick: NEGATIVE
Ketones, ur: 40 mg/dL — AB
Leukocytes,Ua: NEGATIVE
Nitrite: NEGATIVE
Protein, ur: 100 mg/dL — AB
Specific Gravity, Urine: 1.02 (ref 1.005–1.030)
pH: 7 (ref 5.0–8.0)

## 2024-07-22 LAB — URINALYSIS, MICROSCOPIC (REFLEX)

## 2024-07-22 LAB — PREGNANCY, URINE: Preg Test, Ur: NEGATIVE

## 2024-07-22 LAB — MAGNESIUM: Magnesium: 2.1 mg/dL (ref 1.7–2.4)

## 2024-07-22 LAB — LIPASE, BLOOD: Lipase: 39 U/L (ref 11–51)

## 2024-07-22 MED ORDER — IOHEXOL 300 MG/ML  SOLN
80.0000 mL | Freq: Once | INTRAMUSCULAR | Status: AC | PRN
  Administered 2024-07-22: 80 mL via INTRAVENOUS

## 2024-07-22 MED ORDER — DICYCLOMINE HCL 10 MG PO CAPS
10.0000 mg | ORAL_CAPSULE | Freq: Once | ORAL | Status: AC
  Administered 2024-07-22: 10 mg via ORAL
  Filled 2024-07-22: qty 1

## 2024-07-22 MED ORDER — ALUM & MAG HYDROXIDE-SIMETH 200-200-20 MG/5ML PO SUSP
30.0000 mL | Freq: Once | ORAL | Status: AC
  Administered 2024-07-22: 30 mL via ORAL
  Filled 2024-07-22: qty 30

## 2024-07-22 MED ORDER — ONDANSETRON HCL 4 MG/2ML IJ SOLN
4.0000 mg | Freq: Once | INTRAMUSCULAR | Status: AC
  Administered 2024-07-22: 4 mg via INTRAVENOUS
  Filled 2024-07-22: qty 2

## 2024-07-22 MED ORDER — LACTATED RINGERS IV BOLUS
1000.0000 mL | Freq: Once | INTRAVENOUS | Status: AC
  Administered 2024-07-22: 1000 mL via INTRAVENOUS

## 2024-07-22 MED ORDER — DICYCLOMINE HCL 10 MG PO CAPS: 10.0000 mg | ORAL_CAPSULE | Freq: Three times a day (TID) | ORAL | 0 refills | Status: AC | PRN

## 2024-07-22 MED ORDER — IOHEXOL 300 MG/ML  SOLN: 100.0000 mL | Freq: Once | INTRAMUSCULAR | Status: DC | PRN

## 2024-07-22 MED ORDER — OMEPRAZOLE 20 MG PO CPDR: 20.0000 mg | DELAYED_RELEASE_CAPSULE | Freq: Every day | ORAL | 0 refills | Status: AC

## 2024-07-22 NOTE — ED Notes (Signed)
 ED Provider at bedside.

## 2024-07-22 NOTE — ED Notes (Signed)
 Pt ambulatory to bathroom, pt provided ginger ale and crackers for PO challenge

## 2024-07-22 NOTE — ED Notes (Signed)
 Attempt at IV x 1, unable to thread it in.  Site to RAC, pressure dressing applied.

## 2024-07-22 NOTE — ED Provider Notes (Signed)
 [place] [facility] Provider Note   CSN: [id number] Arrival date & time: 07/22/24  1327     Patient presents with: Emesis   Sarah Drake is a 38 y.o. female.   Patient is here for evaluation of persistent nausea and vomiting that began on Tuesday of this week.  Patient is accompanied by her mother today.  She has been evaluated at [facility] for same.  She was discharged from their facility this morning however this symptoms have persisted despite oral Zofran  the patient is here for further evaluation.  Patient reports she has not been able to eat or keep down fluids since Tuesday.  She does admit to attempting a birthday where she had a beer and 4 shots of moonshine which is more than her normal.  She describes herself as a social drinker and does not have a history of heavy drinking.  She has not had a bowel movement since Tuesday as well; this was prior to drinking.  That bowel movement was normal and without blood or melena.  No one else who attended the birthday party has had similar symptoms.  Patient does admit to marijuana use as well, going on to say she used on Tuesday as well as Wednesday.  She has never experienced persistent nausea and vomiting like this in the past.  She denies any syncopal episodes though she does report some lightheadedness with vomiting.  Patient localizes abdominal pain to the epigastric region.  She denies history of pancreatitis.  She denies any recent change in medications or new medications.  She is currently afebrile, does note having a fever yesterday.  Abdominal surgical history includes, cholecystectomy.  The history is provided by the patient and a parent.  Emesis      Prior to Admission medications   Medication Sig Start Date End Date Taking? Authorizing Provider  albuterol  (PROVENTIL ) (2.5 MG/3ML) 0.083% nebulizer solution Take 3 mLs (2.5 mg total) by nebulization every 6 (six) hours as  needed for wheezing or shortness of breath. 10/26/22   Lorin Norris, MD  albuterol  (VENTOLIN  HFA) 108 (90 Base) MCG/ACT inhaler INHALE 2 PUFFS BY MOUTH EVERY 4 HOURS AS NEEDED FOR WHEEZING AND SHORTNESS OF BREATHE. CAN USE 2 PUFFS 10-15 MINUTES PRIOR TO EXERCISING 11/08/23   Lorin Norris, MD  Albuterol -Budesonide  (AIRSUPRA ) 90-80 MCG/ACT AERO Inhale 2 puffs into the lungs 2 (two) times daily as needed. 03/13/24   Lorin Norris, MD  budesonide -formoterol  (SYMBICORT ) 80-4.5 MCG/ACT inhaler Inhale 2 puffs into the lungs 2 (two) times daily as needed. 03/15/24   Lorin Norris, MD  budesonide -glycopyrrolate-formoterol  (BREZTRI  AEROSPHERE) 160-9-4.8 MCG/ACT AERO inhaler Inhale 2 puffs into the lungs 2 (two) times daily. 03/01/24   Lorin Norris, MD  cetirizine  (ZYRTEC ) 10 MG tablet Take 1 tablet (10 mg total) by mouth daily as needed. 03/01/24   Lorin Norris, MD  diphenhydrAMINE  (BENADRYL ) 25 MG tablet Take 25 mg by mouth every 6 (six) hours as needed.    [provider]  fluticasone  (FLONASE ) 50 MCG/ACT nasal spray Place 2 sprays into both nostrils daily. 03/01/24   Lorin Norris, MD  fluticasone -salmeterol (ADVAIR HFA) 115-21 MCG/ACT inhaler Inhale 2 puffs into the lungs 2 (two) times daily. 10/26/22   Lorin Norris, MD  predniSONE  (DELTASONE ) 10 MG tablet Prednisone  10mg  : Take 2 tablets twice a day for 3 more days, Then take 2 tablets once a day for 1 day., then take 1 tablet once a day for 1 day. 03/01/24  Lorin Norris, MD    Allergies: Patient has no known allergies.    Review of Systems  Gastrointestinal:  Positive for vomiting.    Updated Vital Signs BP (!) 144/81 (BP Location: Left Arm)   Pulse 81   Temp 98.3 F (36.8 C)   Resp (!) 23   Ht 5' 5 (1.651 m)   Wt 99.6 kg   SpO2 100%   BMI 36.54 kg/m   Physical Exam Vitals and nursing note reviewed.  Constitutional:      Appearance: Normal appearance.  HENT:     Head: Normocephalic and atraumatic.      Mouth/Throat:     Mouth: Mucous membranes are moist.  Eyes:     General: No scleral icterus.    Extraocular Movements: Extraocular movements intact.     Conjunctiva/sclera: Conjunctivae normal.  Cardiovascular:     Rate and Rhythm: Normal rate and regular rhythm.     Pulses: Normal pulses.     Heart sounds: Normal heart sounds.  Pulmonary:     Effort: Pulmonary effort is normal. No respiratory distress.     Breath sounds: Normal breath sounds. No stridor. No wheezing, rhonchi or rales.  Abdominal:     General: Abdomen is flat. Bowel sounds are normal. There is no distension.     Palpations: Abdomen is soft.     Tenderness: There is abdominal tenderness in the right lower quadrant, epigastric area and left lower quadrant. There is guarding. There is no rebound. Negative signs include Murphy's sign, Rovsing's sign and McBurney's sign.  Skin:    General: Skin is warm and dry.     Capillary Refill: Capillary refill takes less than 2 seconds.     Coloration: Skin is not jaundiced or pale.  Neurological:     Mental Status: She is alert and oriented to person, place, and time.     (all labs ordered are listed, but only abnormal results are displayed) Labs Reviewed  CBC  COMPREHENSIVE METABOLIC PANEL WITH GFR  LIPASE, BLOOD  URINALYSIS, ROUTINE W REFLEX MICROSCOPIC  PREGNANCY, URINE  URINE DRUG SCREEN    EKG: None  Radiology: No results found. Procedures   Medications Ordered in the ED - No data to display   Patient presents to the ED for concern of persistent nausea, vomiting, and abdominal pain, this involves an extensive number of treatment options, and is a complaint that carries with it a high risk of complications and morbidity.  The differential diagnosis includes gastroenteritis, foodborne illness, marijuana hyperemesis, bowel obstruction, UTI,   Co morbidities that complicate the patient evaluation  Cholecystectomy GERD Asthma   Additional history  obtained:  Additional history obtained from  Outside Medical Records and Past Admission   External records from outside source obtained and reviewed including recent treatment and admission at [facility] for same symptoms.   Lab Tests:  I Ordered, and personally interpreted labs.  The pertinent results include: Elevated AST and ALT with normal total bilirubin and alk phos.  Potassium of 3.2 in the setting of persistent vomiting and limited food/fluid intake.  Magnesium  and lipase normal.  Urine drug screen is positive for THC.  No evidence of UTI on UA.   Imaging Studies ordered:  I ordered imaging studies including CT abdomen pelvis with contrast Imaging which showed thickening of the walls of the duodenal bulb with associated inflammatory tissue changes compatible with duodenitis.   Cardiac Monitoring:  The patient was maintained on a cardiac monitor.  I personally  viewed and interpreted the cardiac monitored which showed an underlying rhythm of: Sinus rhythm with no evidence of STEMI.   Medicines ordered and prescription drug management:  I ordered medication including Zofran , 2 L lactated Ringer 's, Bentyl , Maalox/Mylanta for nausea/vomiting, abdominal pain, and duodenitis. Reevaluation of the patient after these medicines showed that the patient improved.  Patient able to tolerate p.o. intake of food and fluids after medication administration. I have reviewed the patients home medicines and have made adjustments as needed   Problem List / ED Course:  Persistent nausea and vomiting with associated abdominal pain.  Blood work and urinalysis performed.  Imaging concerning for duodenitis.  Considering HPI this duodenitis may have been caused by patient's recent EtOH intake.  It is reassuring that patient is able to tolerate p.o. food and fluid intake with administration of medications today.  Stable for discharge home with prescription medications and encouraged to follow  closely with primary care for continued evaluation.   Reevaluation:  After the interventions noted above, I reevaluated the patient and found that they have :improved   Dispostion:  After consideration of the diagnostic results and the patients response to treatment, I feel that the patent would benefit from continued supportive care in the home setting with prescription medications (PPI, Zofran , Bentyl ) and close follow-up with primary care for continued evaluation of resolution of symptoms.  If patient continues to experience similar symptoms she was encouraged to reach out and establish with a gastroenterologist for potential management.  Return precautions given.   Medical Decision Making Amount and/or Complexity of Data Reviewed Labs: ordered. Radiology: ordered.  Risk OTC drugs. Prescription drug management.   This note was produced using Electronics Engineer. While the provider has reviewed and verified all clinical information, transcription errors may remain.     Final diagnoses:  None    ED Discharge Orders     None          Rosina Almarie LABOR, PA-C 07/23/24 0053    Elnor Bernarda SQUIBB, DO 08/01/24 1409

## 2024-07-22 NOTE — Discharge Instructions (Addendum)
 It was a pleasure meeting with you today.  As we discussed your imaging showed duodenitis or inflammation of your small intestines.  In the ED today you received fluids, Zofran , and Bentyl  for your nausea and abdominal pain.  You were also given an IV proton pump inhibitor to help with the small intestine inflammation.  It is reassuring that you were able to tolerate both oral intake of fluids and food after taking these medications.  Prescription for Bentyl  and a proton pump inhibitor have been sent to your pharmacy.  Stick to a bland diet for the next week to allow your intestines to heal.  Follow-up with your primary care provider in the next 3 to 4 days for ongoing evaluation of symptoms.  If you continue to experience these symptoms you may need to establish with a gastroenterologist for further evaluation and management.

## 2024-07-22 NOTE — ED Notes (Signed)
 Pt tolerated PO challenge. Pt tolerated fluids, but stated when she ate a cracker it made her stomach hurt a little bit. Provider notified

## 2024-07-22 NOTE — ED Triage Notes (Signed)
 Pt states feeling short of breath since [facility]  Pt also reports emesis that never stopped.  Pt states abd pain, states no BM since Tuesday  Zofran  not helping,  took last at 0800
